# Patient Record
Sex: Female | Born: 1975
Health system: Southern US, Community
[De-identification: ages and names within clinical notes are randomized; demographics above are authoritative.]

## PROBLEM LIST (undated history)

## (undated) ENCOUNTER — Inpatient Hospital Stay (HOSPITAL_COMMUNITY): Payer: Self-pay

## (undated) DIAGNOSIS — O09529 Supervision of elderly multigravida, unspecified trimester: Secondary | ICD-10-CM

## (undated) DIAGNOSIS — D219 Benign neoplasm of connective and other soft tissue, unspecified: Secondary | ICD-10-CM

## (undated) DIAGNOSIS — N83209 Unspecified ovarian cyst, unspecified side: Secondary | ICD-10-CM

## (undated) DIAGNOSIS — R51 Headache: Secondary | ICD-10-CM

## (undated) DIAGNOSIS — R519 Headache, unspecified: Secondary | ICD-10-CM

## (undated) DIAGNOSIS — D649 Anemia, unspecified: Secondary | ICD-10-CM

## (undated) DIAGNOSIS — Z789 Other specified health status: Secondary | ICD-10-CM

## (undated) HISTORY — DX: Headache: R51

## (undated) HISTORY — DX: Supervision of elderly multigravida, unspecified trimester: O09.529

## (undated) HISTORY — DX: Anemia, unspecified: D64.9

## (undated) HISTORY — PX: NO PAST SURGERIES: SHX2092

## (undated) HISTORY — DX: Headache, unspecified: R51.9

## (undated) HISTORY — DX: Benign neoplasm of connective and other soft tissue, unspecified: D21.9

---

## 1997-06-13 DIAGNOSIS — K759 Inflammatory liver disease, unspecified: Secondary | ICD-10-CM

## 1997-06-13 HISTORY — DX: Inflammatory liver disease, unspecified: K75.9

## 2003-06-14 DIAGNOSIS — H11002 Unspecified pterygium of left eye: Secondary | ICD-10-CM

## 2003-06-14 HISTORY — DX: Unspecified pterygium of left eye: H11.002

## 2006-03-06 ENCOUNTER — Inpatient Hospital Stay (HOSPITAL_COMMUNITY): Admission: AD | Admit: 2006-03-06 | Discharge: 2006-03-06 | Payer: Self-pay | Admitting: Gynecology

## 2006-03-06 ENCOUNTER — Ambulatory Visit: Payer: Self-pay | Admitting: Obstetrics & Gynecology

## 2006-04-01 ENCOUNTER — Inpatient Hospital Stay (HOSPITAL_COMMUNITY): Admission: AD | Admit: 2006-04-01 | Discharge: 2006-04-03 | Payer: Self-pay | Admitting: Obstetrics & Gynecology

## 2006-04-01 ENCOUNTER — Ambulatory Visit: Payer: Self-pay | Admitting: Obstetrics and Gynecology

## 2009-06-06 ENCOUNTER — Inpatient Hospital Stay (HOSPITAL_COMMUNITY): Admission: AD | Admit: 2009-06-06 | Discharge: 2009-06-08 | Payer: Self-pay | Admitting: Obstetrics

## 2009-11-12 ENCOUNTER — Emergency Department (HOSPITAL_COMMUNITY): Admission: EM | Admit: 2009-11-12 | Discharge: 2009-11-12 | Payer: Self-pay | Admitting: Emergency Medicine

## 2009-12-23 ENCOUNTER — Ambulatory Visit: Payer: Self-pay | Admitting: Family Medicine

## 2009-12-23 DIAGNOSIS — S239XXA Sprain of unspecified parts of thorax, initial encounter: Secondary | ICD-10-CM | POA: Insufficient documentation

## 2009-12-23 DIAGNOSIS — S139XXA Sprain of joints and ligaments of unspecified parts of neck, initial encounter: Secondary | ICD-10-CM | POA: Insufficient documentation

## 2010-03-20 ENCOUNTER — Emergency Department (HOSPITAL_COMMUNITY): Admission: EM | Admit: 2010-03-20 | Discharge: 2010-03-20 | Payer: Self-pay | Admitting: Emergency Medicine
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
## 2010-08-26 LAB — URINALYSIS, ROUTINE W REFLEX MICROSCOPIC
Ketones, ur: NEGATIVE mg/dL
Nitrite: NEGATIVE
Specific Gravity, Urine: 1.014 (ref 1.005–1.030)
pH: 7.5 (ref 5.0–8.0)

## 2010-08-26 LAB — COMPREHENSIVE METABOLIC PANEL
AST: 15 U/L (ref 0–37)
Albumin: 3.1 g/dL — ABNORMAL LOW (ref 3.5–5.2)
Calcium: 8.8 mg/dL (ref 8.4–10.5)
Creatinine, Ser: 0.72 mg/dL (ref 0.4–1.2)
GFR calc Af Amer: >60 mL/min (ref >60)

## 2010-08-26 LAB — CBC
MCH: 27.5 pg (ref 26.0–34.0)
MCHC: 31.9 g/dL (ref 30.0–36.0)
Platelets: 272 10*3/uL (ref 150–400)
RBC: 3.82 MIL/uL — ABNORMAL LOW (ref 3.87–5.11)

## 2010-08-26 LAB — DIFFERENTIAL
Eosinophils Relative: 2 % (ref 0–5)
Lymphocytes Relative: 28 % (ref 12–46)
Lymphs Abs: 2 10*3/uL (ref 0.7–4.0)
Monocytes Absolute: 0.4 10*3/uL (ref 0.1–1.0)
Monocytes Relative: 5 % (ref 3–12)

## 2010-08-26 LAB — POCT PREGNANCY, URINE: Preg Test, Ur: NEGATIVE

## 2010-08-26 LAB — URINE MICROSCOPIC-ADD ON

## 2010-09-13 LAB — CBC
Hemoglobin: 11.9 g/dL — ABNORMAL LOW (ref 12.0–15.0)
MCHC: 32.7 g/dL (ref 30.0–36.0)
MCV: 86 fL (ref 78.0–100.0)
Platelets: 160 10*3/uL (ref 150–400)
RDW: 15.3 % (ref 11.5–15.5)
RDW: 15.5 % (ref 11.5–15.5)
WBC: 9.3 10*3/uL (ref 4.0–10.5)

## 2011-03-17 ENCOUNTER — Emergency Department (HOSPITAL_COMMUNITY)
Admission: EM | Admit: 2011-03-17 | Discharge: 2011-03-17 | Disposition: A | Discharge status: Home / Self Care | Payer: Self-pay | Attending: Emergency Medicine | Admitting: Emergency Medicine

## 2011-03-17 DIAGNOSIS — L03019 Cellulitis of unspecified finger: Secondary | ICD-10-CM | POA: Insufficient documentation

## 2011-03-17 DIAGNOSIS — M79609 Pain in unspecified limb: Secondary | ICD-10-CM | POA: Insufficient documentation

## 2013-03-17 ENCOUNTER — Encounter (HOSPITAL_COMMUNITY): Payer: Self-pay | Admitting: Cardiology

## 2013-03-17 ENCOUNTER — Emergency Department (HOSPITAL_COMMUNITY)
Admission: EM | Admit: 2013-03-17 | Discharge: 2013-03-17 | Disposition: A | Discharge status: Home / Self Care | Payer: Self-pay | Attending: Emergency Medicine | Admitting: Emergency Medicine

## 2013-03-17 ENCOUNTER — Emergency Department (HOSPITAL_COMMUNITY): Payer: Self-pay

## 2013-03-17 DIAGNOSIS — N949 Unspecified condition associated with female genital organs and menstrual cycle: Secondary | ICD-10-CM | POA: Insufficient documentation

## 2013-03-17 DIAGNOSIS — R110 Nausea: Secondary | ICD-10-CM | POA: Insufficient documentation

## 2013-03-17 DIAGNOSIS — Z3202 Encounter for pregnancy test, result negative: Secondary | ICD-10-CM | POA: Insufficient documentation

## 2013-03-17 DIAGNOSIS — R109 Unspecified abdominal pain: Secondary | ICD-10-CM | POA: Insufficient documentation

## 2013-03-17 DIAGNOSIS — N83209 Unspecified ovarian cyst, unspecified side: Secondary | ICD-10-CM | POA: Insufficient documentation

## 2013-03-17 DIAGNOSIS — Z79899 Other long term (current) drug therapy: Secondary | ICD-10-CM | POA: Insufficient documentation

## 2013-03-17 DIAGNOSIS — N83201 Unspecified ovarian cyst, right side: Secondary | ICD-10-CM

## 2013-03-17 DIAGNOSIS — D259 Leiomyoma of uterus, unspecified: Secondary | ICD-10-CM | POA: Insufficient documentation

## 2013-03-17 LAB — URINALYSIS, ROUTINE W REFLEX MICROSCOPIC
Bilirubin Urine: NEGATIVE
Glucose, UA: NEGATIVE mg/dL
Protein, ur: NEGATIVE mg/dL
Urobilinogen, UA: 0.2 mg/dL (ref 0.0–1.0)

## 2013-03-17 LAB — CBC WITH DIFFERENTIAL/PLATELET
Basophils Absolute: 0 10*3/uL (ref 0.0–0.1)
HCT: 37.1 % (ref 36.0–46.0)
Lymphocytes Relative: 28 % (ref 12–46)
Neutro Abs: 4.5 10*3/uL (ref 1.7–7.7)
Neutrophils Relative %: 65 % (ref 43–77)
Platelets: 252 10*3/uL (ref 150–400)
RDW: 13.1 % (ref 11.5–15.5)
WBC: 7 10*3/uL (ref 4.0–10.5)

## 2013-03-17 LAB — COMPREHENSIVE METABOLIC PANEL
ALT: 9 U/L (ref 0–35)
AST: 11 U/L (ref 0–37)
Albumin: 3.2 g/dL — ABNORMAL LOW (ref 3.5–5.2)
CO2: 26 mEq/L (ref 19–32)
Chloride: 103 mEq/L (ref 96–112)
GFR calc non Af Amer: >90 mL/min (ref >90)
Potassium: 3.3 mEq/L — ABNORMAL LOW (ref 3.5–5.1)
Sodium: 138 mEq/L (ref 135–145)
Total Bilirubin: 0.3 mg/dL (ref 0.3–1.2)

## 2013-03-17 LAB — WET PREP, GENITAL: Trich, Wet Prep: NONE SEEN

## 2013-03-17 LAB — URINE MICROSCOPIC-ADD ON

## 2013-03-17 MED ORDER — OXYCODONE-ACETAMINOPHEN 5-325 MG PO TABS: 1.0000 | ORAL_TABLET | Freq: Four times a day (QID) | ORAL | Status: DC | PRN

## 2013-03-17 MED ORDER — IBUPROFEN 200 MG PO TABS
600.0000 mg | ORAL_TABLET | Freq: Once | ORAL | Status: AC
  Administered 2013-03-17: 600 mg via ORAL
  Filled 2013-03-17: qty 3

## 2013-03-17 MED ORDER — ONDANSETRON 4 MG PO TBDP
8.0000 mg | ORAL_TABLET | Freq: Once | ORAL | Status: AC
  Administered 2013-03-17: 8 mg via ORAL
  Filled 2013-03-17: qty 2

## 2013-03-17 MED ORDER — OXYCODONE-ACETAMINOPHEN 5-325 MG PO TABS
1.0000 | ORAL_TABLET | Freq: Once | ORAL | Status: AC
  Administered 2013-03-17: 1 via ORAL
  Filled 2013-03-17: qty 1

## 2013-03-17 MED ORDER — HYDROMORPHONE HCL PF 2 MG/ML IJ SOLN
2.0000 mg | Freq: Once | INTRAMUSCULAR | Status: AC
  Administered 2013-03-17: 2 mg via INTRAMUSCULAR
  Filled 2013-03-17: qty 1

## 2013-03-17 MED ORDER — NAPROXEN 500 MG PO TABS: 500.0000 mg | ORAL_TABLET | Freq: Two times a day (BID) | ORAL | Status: DC

## 2013-03-17 NOTE — ED Notes (Signed)
Pt reports she is still having a lot of pain. Provider informed.

## 2013-03-17 NOTE — ED Notes (Signed)
Pt reports right lower quad pain that started yesterday. States she has had some nausea but no vomiting. Denies any urinary symptoms at this time. No abd surgeries but reports hx of fibroids.

## 2013-03-17 NOTE — ED Provider Notes (Signed)
Medical screening examination/treatment/procedure(s) were performed by non-physician practitioner and as supervising physician I was immediately available for consultation/collaboration.  Aariah Godette L Novaleigh Kohlman, MD 03/17/13 1721 

## 2013-03-17 NOTE — ED Notes (Signed)
Pt up to the bathroom

## 2013-03-17 NOTE — ED Provider Notes (Signed)
CSN: [id number]     Arrival date & time 03/17/13  0807 History   First MD Initiated Contact with Patient 03/17/13 0813     Chief Complaint  Patient presents with  . Abdominal Pain   (Consider location/radiation/quality/duration/timing/severity/associated sxs/prior Treatment) HPI Comments: Patient presents with complaint of intermittent right lower cardia pain for the past 12 hours. Pain is sharp and not worse with movement. It started in the right lower quadrant. It has been associated with nausea but no vomiting. No fever, diarrhea, dysuria or hematuria, vaginal bleeding or discharge. Patient is currently on birth control and states that she has not had a period in a long time. She has a history of fibroids/endometriosis? She has never had any abdominal or pelvic surgeries. No treatments prior to arrival. Onset of symptoms acute. Nothing makes the pain better or worse.  Patient is a 37 y.o. female presenting with abdominal pain. The history is provided by the patient.  Abdominal Pain Associated symptoms: nausea   Associated symptoms: no chest pain, no cough, no diarrhea, no dysuria, no fever, no hematuria, no sore throat, no vaginal bleeding, no vaginal discharge and no vomiting     History reviewed. No pertinent past medical history. History reviewed. No pertinent past surgical history. History reviewed. No pertinent family history. History  Substance Use Topics  . Smoking status: Never Smoker   . Smokeless tobacco: Not on file  . Alcohol Use: No   OB History   Grav Para Term Preterm Abortions TAB SAB Ect Mult Living                 Review of Systems  Constitutional: Negative for fever.  HENT: Negative for sore throat and rhinorrhea.   Eyes: Negative for redness.  Respiratory: Negative for cough.   Cardiovascular: Negative for chest pain.  Gastrointestinal: Positive for nausea and abdominal pain. Negative for vomiting and diarrhea.  Genitourinary: Positive for pelvic pain.  Negative for dysuria, hematuria, vaginal bleeding and vaginal discharge.  Musculoskeletal: Negative for myalgias.  Skin: Negative for rash.  Neurological: Negative for headaches.    Allergies  Review of patient's allergies indicates no known allergies.  Home Medications   Current Outpatient Rx  Name  Route  Sig  Dispense  Refill  . norgestimate-ethinyl estradiol (ORTHO-CYCLEN,SPRINTEC,PREVIFEM) 0.25-35 MG-MCG tablet   Oral   Take 1 tablet by mouth daily.          BP 107/72  Pulse 69  Temp(Src) 98 F (36.7 C) (Oral)  Resp 18  Ht 5\' 3"  (1.6 m)  Wt 230 lb (104.327 kg)  BMI 40.75 kg/m2  SpO2 100% Physical Exam  Nursing note and vitals reviewed. Constitutional: She appears well-developed and well-nourished.  HENT:  Head: Normocephalic and atraumatic.  Eyes: Conjunctivae are normal. Right eye exhibits no discharge. Left eye exhibits no discharge.  Neck: Normal range of motion. Neck supple.  Cardiovascular: Normal rate, regular rhythm and normal heart sounds.   Pulmonary/Chest: Effort normal and breath sounds normal.  Abdominal: Soft. Bowel sounds are normal. She exhibits no distension. There is tenderness in the right lower quadrant and suprapubic area. There is no rebound, no guarding and no CVA tenderness.  Genitourinary: Uterus is not enlarged and not tender. Cervix exhibits no motion tenderness, no discharge and no friability. Right adnexum displays tenderness. Right adnexum displays no mass and no fullness. Left adnexum displays no mass, no tenderness and no fullness. No tenderness or bleeding around the vagina. No foreign body around the vagina. No vaginal  discharge found.  Neurological: She is alert.  Skin: Skin is warm and dry.  Psychiatric: She has a normal mood and affect.    ED Course  Procedures (including critical care time) Labs Review Labs Reviewed  WET PREP, GENITAL - Abnormal; Notable for the following:    Clue Cells Wet Prep HPF POC FEW (*)    WBC, Wet  Prep HPF POC FEW (*)    All other components within normal limits  COMPREHENSIVE METABOLIC PANEL - Abnormal; Notable for the following:    Potassium 3.3 (*)    Albumin 3.2 (*)    Alkaline Phosphatase 37 (*)    All other components within normal limits  URINALYSIS, ROUTINE W REFLEX MICROSCOPIC - Abnormal; Notable for the following:    APPearance TURBID (*)    Hgb urine dipstick SMALL (*)    Leukocytes, UA SMALL (*)    All other components within normal limits  URINE MICROSCOPIC-ADD ON - Abnormal; Notable for the following:    Squamous Epithelial / LPF MANY (*)    Bacteria, UA MANY (*)    All other components within normal limits  GC/CHLAMYDIA PROBE AMP  URINE CULTURE  CBC WITH DIFFERENTIAL  POCT PREGNANCY, URINE   Imaging Review US Transvaginal Non-ob  03/17/2013   CLINICAL DATA:  Right lower quadrant pain. Concern for ovarian torsion. History of endometriosis.  EXAM: TRANSABDOMINAL AND TRANSVAGINAL ULTRASOUND OF PELVIS  DOPPLER ULTRASOUND OF OVARIES  TECHNIQUE: Both transabdominal and transvaginal ultrasound examinations of the pelvis were performed. Transabdominal technique was performed for global imaging of the pelvis including uterus, ovaries, adnexal regions, and pelvic cul-de-sac.  It was necessary to proceed with endovaginal exam following the transabdominal exam to visualize the endometrium and ovaries. Color and duplex Doppler ultrasound was utilized to evaluate blood flow to the ovaries.  COMPARISON:  None.  FINDINGS: Uterus  Measurements: Normal size 11.1 x 6.8 x 2.7 cm. The uterus does have heterogeneous echotexture. There are several rounded hypoechoic lesions consistent with leiomyomas. These leiomyomas are intramural with the largest measuring 3.4 cm. There are approximately 4 such leiomyomas along the posterior wall. No fibroids or other mass visualized.  Endometrium  Thickness: Normal in thickness for premenopausal female at 9 mm No focal abnormality visualized.  Right ovary   Measurements: Mildly enlarged at 5.4 x 4.5 x 4.0 cm. This enlargement is secondary to a rounded primarily anechoic cyst within the right ovary measuring 4.6 x 3.9 by 3.5 cm. There are scattered internal echoes within this minimally complex cyst. There is no significant flow or septation. .  Left ovary  Measurements: Normal at 3.8 x 1.1 x 2.1 cm. Normal appearance/no adnexal mass.  Pulsed Doppler evaluation of both ovaries demonstrates normal low-resistance arterial and venous waveforms.  Other findings  Small amount of free fluid the posterior cul-de-sac.  IMPRESSION: 1. Normal color Doppler flow and spectral waveforms to the left right ovary. No evidence of torsion.  2. Mildly complex cyst associated with the right ovary measuring up to 4.6 cm is likely a benign hemorrhagic cyst.  This is almost certainly benign, and no specific imaging follow up is recommended according to the Society of Radiologists in Ultrasound2010 Consensus Conference Statement Grier Rocher et al. Management of Asymptomatic Ovarian and Other Adnexal Cysts Imaged at [place]: Society of Radiologists in Ultrasound Consensus Conference Statement 2010. Radiology 256 (Sept 2010): 943-954.).  3. Four intramural leiomyoma within the uterus.   Electronically Signed   By: Genevive Bi M.D.   On: 03/17/2013  11:11   US Pelvis Complete  03/17/2013   CLINICAL DATA:  Right lower quadrant pain. Concern for ovarian torsion. History of endometriosis.  EXAM: TRANSABDOMINAL AND TRANSVAGINAL ULTRASOUND OF PELVIS  DOPPLER ULTRASOUND OF OVARIES  TECHNIQUE: Both transabdominal and transvaginal ultrasound examinations of the pelvis were performed. Transabdominal technique was performed for global imaging of the pelvis including uterus, ovaries, adnexal regions, and pelvic cul-de-sac.  It was necessary to proceed with endovaginal exam following the transabdominal exam to visualize the endometrium and ovaries. Color and duplex Doppler ultrasound was utilized to evaluate  blood flow to the ovaries.  COMPARISON:  None.  FINDINGS: Uterus  Measurements: Normal size 11.1 x 6.8 x 2.7 cm. The uterus does have heterogeneous echotexture. There are several rounded hypoechoic lesions consistent with leiomyomas. These leiomyomas are intramural with the largest measuring 3.4 cm. There are approximately 4 such leiomyomas along the posterior wall. No fibroids or other mass visualized.  Endometrium  Thickness: Normal in thickness for premenopausal female at 9 mm No focal abnormality visualized.  Right ovary  Measurements: Mildly enlarged at 5.4 x 4.5 x 4.0 cm. This enlargement is secondary to a rounded primarily anechoic cyst within the right ovary measuring 4.6 x 3.9 by 3.5 cm. There are scattered internal echoes within this minimally complex cyst. There is no significant flow or septation. .  Left ovary  Measurements: Normal at 3.8 x 1.1 x 2.1 cm. Normal appearance/no adnexal mass.  Pulsed Doppler evaluation of both ovaries demonstrates normal low-resistance arterial and venous waveforms.  Other findings  Small amount of free fluid the posterior cul-de-sac.  IMPRESSION: 1. Normal color Doppler flow and spectral waveforms to the left right ovary. No evidence of torsion.  2. Mildly complex cyst associated with the right ovary measuring up to 4.6 cm is likely a benign hemorrhagic cyst.  This is almost certainly benign, and no specific imaging follow up is recommended according to the Society of Radiologists in Ultrasound2010 Consensus Conference Statement Grier Rocher et al. Management of Asymptomatic Ovarian and Other Adnexal Cysts Imaged at [place]: Society of Radiologists in Ultrasound Consensus Conference Statement 2010. Radiology 256 (Sept 2010): 943-954.).  3. Four intramural leiomyoma within the uterus.   Electronically Signed   By: Genevive Bi M.D.   On: 03/17/2013 11:11   [place] Art/ven Flow Abd Pelv Doppler  03/17/2013   CLINICAL DATA:  Right lower quadrant pain. Concern for ovarian torsion.  History of endometriosis.  EXAM: TRANSABDOMINAL AND TRANSVAGINAL ULTRASOUND OF PELVIS  DOPPLER ULTRASOUND OF OVARIES  TECHNIQUE: Both transabdominal and transvaginal ultrasound examinations of the pelvis were performed. Transabdominal technique was performed for global imaging of the pelvis including uterus, ovaries, adnexal regions, and pelvic cul-de-sac.  It was necessary to proceed with endovaginal exam following the transabdominal exam to visualize the endometrium and ovaries. Color and duplex Doppler ultrasound was utilized to evaluate blood flow to the ovaries.  COMPARISON:  None.  FINDINGS: Uterus  Measurements: Normal size 11.1 x 6.8 x 2.7 cm. The uterus does have heterogeneous echotexture. There are several rounded hypoechoic lesions consistent with leiomyomas. These leiomyomas are intramural with the largest measuring 3.4 cm. There are approximately 4 such leiomyomas along the posterior wall. No fibroids or other mass visualized.  Endometrium  Thickness: Normal in thickness for premenopausal female at 9 mm No focal abnormality visualized.  Right ovary  Measurements: Mildly enlarged at 5.4 x 4.5 x 4.0 cm. This enlargement is secondary to a rounded primarily anechoic cyst within the right ovary measuring 4.6  x 3.9 by 3.5 cm. There are scattered internal echoes within this minimally complex cyst. There is no significant flow or septation. .  Left ovary  Measurements: Normal at 3.8 x 1.1 x 2.1 cm. Normal appearance/no adnexal mass.  Pulsed Doppler evaluation of both ovaries demonstrates normal low-resistance arterial and venous waveforms.  Other findings  Small amount of free fluid the posterior cul-de-sac.  IMPRESSION: 1. Normal color Doppler flow and spectral waveforms to the left right ovary. No evidence of torsion.  2. Mildly complex cyst associated with the right ovary measuring up to 4.6 cm is likely a benign hemorrhagic cyst.  This is almost certainly benign, and no specific imaging follow up is  recommended according to the Society of Radiologists in Ultrasound2010 Consensus Conference Statement Grier Rocher et al. Management of Asymptomatic Ovarian and Other Adnexal Cysts Imaged at [place]: Society of Radiologists in Ultrasound Consensus Conference Statement 2010. Radiology 256 (Sept 2010): 943-954.).  3. Four intramural leiomyoma within the uterus.   Electronically Signed   By: Genevive Bi M.D.   On: 03/17/2013 11:11    8:41 AM Patient seen and examined. Work-up initiated. Medications ordered.   Vital signs reviewed and are as follows: Filed Vitals:   03/17/13 0821  BP: 107/72  Pulse: 69  Temp: 98 F (36.7 C)  Resp: 18   Pt updated. Pelvic performed with nurse tech chaperone.   12:16 PM Pelvic ultrasound performed. Pt informed of results. Will d/c to home with comfort measures, GYN f/u. Pt did require IM pain medication prior to discharge.   The patient was urged to return to the Emergency Department immediately with worsening of current symptoms, worsening abdominal pain, persistent vomiting, blood noted in stools, fever, or any other concerns. The patient verbalized understanding.     MDM   1. Ovarian cyst, right   2. Uterine fibroid    Patient with right lower quadrant abdominal pain. Pain is consistent with ovarian cyst, with acute onset, no other systemic symptoms including fever. WBC count is normal. Ultrasound shows no torsion. The ultrasound also shows uterine fibroids. Patient otherwise appears well. I do not suspect appendicitis given history and alternative diagnosis suggested by ultrasound. GYN follow up given. UA not clean catch, history not particularly suspicious for urinary tract infection. Culture sent.    Renne Crigler, PA-C 03/17/13 1219

## 2013-03-18 LAB — GC/CHLAMYDIA PROBE AMP
CT Probe RNA: NEGATIVE
GC Probe RNA: NEGATIVE

## 2013-03-19 LAB — URINE CULTURE

## 2013-03-29 ENCOUNTER — Ambulatory Visit (INDEPENDENT_AMBULATORY_CARE_PROVIDER_SITE_OTHER): Payer: Self-pay

## 2013-03-29 DIAGNOSIS — Z23 Encounter for immunization: Secondary | ICD-10-CM

## 2014-06-09 ENCOUNTER — Other Ambulatory Visit (HOSPITAL_COMMUNITY): Payer: Self-pay | Admitting: *Deleted

## 2014-06-09 DIAGNOSIS — N63 Unspecified lump in unspecified breast: Secondary | ICD-10-CM

## 2014-06-26 ENCOUNTER — Other Ambulatory Visit: Payer: Self-pay

## 2014-06-26 ENCOUNTER — Inpatient Hospital Stay: Admission: RE | Admit: 2014-06-26 | Payer: Self-pay | Source: Ambulatory Visit

## 2014-06-26 ENCOUNTER — Ambulatory Visit (HOSPITAL_COMMUNITY): Payer: Self-pay | Attending: Obstetrics and Gynecology

## 2014-07-17 ENCOUNTER — Ambulatory Visit
Admission: RE | Admit: 2014-07-17 | Discharge: 2014-07-17 | Disposition: A | Discharge status: Home / Self Care | Payer: No Typology Code available for payment source | Source: Ambulatory Visit | Attending: Obstetrics and Gynecology | Admitting: Obstetrics and Gynecology

## 2014-07-17 ENCOUNTER — Ambulatory Visit (HOSPITAL_COMMUNITY)
Admission: RE | Admit: 2014-07-17 | Discharge: 2014-07-17 | Disposition: A | Discharge status: Home / Self Care | Payer: Self-pay | Source: Ambulatory Visit | Attending: Obstetrics and Gynecology | Admitting: Obstetrics and Gynecology

## 2014-07-17 ENCOUNTER — Other Ambulatory Visit: Payer: Self-pay

## 2014-07-17 ENCOUNTER — Encounter (HOSPITAL_COMMUNITY): Payer: Self-pay

## 2014-07-17 VITALS — BP 116/74 | Temp 98.4°F | Ht 64.0 in | Wt 225.2 lb

## 2014-07-17 DIAGNOSIS — N6321 Unspecified lump in the left breast, upper outer quadrant: Secondary | ICD-10-CM

## 2014-07-17 DIAGNOSIS — N6323 Unspecified lump in the left breast, lower outer quadrant: Secondary | ICD-10-CM

## 2014-07-17 DIAGNOSIS — N63 Unspecified lump in unspecified breast: Secondary | ICD-10-CM

## 2014-07-17 DIAGNOSIS — Z1239 Encounter for other screening for malignant neoplasm of breast: Secondary | ICD-10-CM

## 2014-07-17 NOTE — Patient Instructions (Signed)
Explained to Kristy Johnson that she needs a Pap smear today due to last Pap smear was 02/03/2011. Patient is currently on her menstrual period and has scheduled an appointment at the [facility] Department for a Pap smear on 08/08/2014. Referred patient to the [facility] for diagnostic mammogram and possible bilateral breast ultrasounds. Appointment scheduled for Thursday, July 17, 2014 at 1300. Patient aware of appointment and will be there. Noel Journey Lares verbalized understanding.  Donn Zanetti, Arvil [place], RN 12:47 PM

## 2014-07-17 NOTE — Progress Notes (Signed)
Complaints of bilateral breasts lumps and pain. Patient stated she is no longer have pain and thinks the lumps may have decreases in size since May 2015.  Pap Smear:  Pap smear not completed today. Last Pap smear was 02/03/2011 at [facility] and normal. Per patient has no history of an abnormal Pap smear. Unable to complete Pap smear today due to patient being on her menstrual period. Patient is scheduled to have a Pap smear completed at the [facility] Department on 08/08/2014. Last Pap smear result is in EPIC.  Physical exam: Breasts Breasts symmetrical. No skin abnormalities bilateral breasts. No nipple retraction bilateral breasts. No nipple discharge bilateral breasts. No lymphadenopathy. No lumps palpated right breast. Palpated two lumps within the left breast at 2 o'clock 5 cm from the nipple and 5 o'clock under the areola. Complaints of tenderness when palpated both lumps and the right lower breast on exam. Referred patient to the [facility] for diagnostic mammogram and possible bilateral breast ultrasounds. Appointment scheduled for Thursday, July 17, 2014 at 1300.     Pelvic/Bimanual No Pap smear completed today since patient is currently on her menstrual period. Pap smear not indicated per BCCCP guidelines.

## 2014-08-18 ENCOUNTER — Encounter (HOSPITAL_COMMUNITY): Payer: Self-pay | Admitting: *Deleted

## 2015-06-15 ENCOUNTER — Emergency Department (HOSPITAL_COMMUNITY)
Admission: EM | Admit: 2015-06-15 | Discharge: 2015-06-15 | Disposition: A | Discharge status: Home / Self Care | Payer: No Typology Code available for payment source | Attending: Emergency Medicine | Admitting: Emergency Medicine

## 2015-06-15 ENCOUNTER — Encounter (HOSPITAL_COMMUNITY): Payer: Self-pay | Admitting: Emergency Medicine

## 2015-06-15 ENCOUNTER — Emergency Department (HOSPITAL_COMMUNITY): Payer: No Typology Code available for payment source

## 2015-06-15 DIAGNOSIS — R519 Headache, unspecified: Secondary | ICD-10-CM

## 2015-06-15 DIAGNOSIS — S0993XA Unspecified injury of face, initial encounter: Secondary | ICD-10-CM | POA: Diagnosis not present

## 2015-06-15 DIAGNOSIS — R51 Headache: Secondary | ICD-10-CM

## 2015-06-15 DIAGNOSIS — Y998 Other external cause status: Secondary | ICD-10-CM | POA: Diagnosis not present

## 2015-06-15 DIAGNOSIS — S4992XA Unspecified injury of left shoulder and upper arm, initial encounter: Secondary | ICD-10-CM | POA: Diagnosis not present

## 2015-06-15 DIAGNOSIS — S0990XA Unspecified injury of head, initial encounter: Secondary | ICD-10-CM | POA: Insufficient documentation

## 2015-06-15 DIAGNOSIS — Y9389 Activity, other specified: Secondary | ICD-10-CM | POA: Diagnosis not present

## 2015-06-15 DIAGNOSIS — S79912A Unspecified injury of left hip, initial encounter: Secondary | ICD-10-CM | POA: Insufficient documentation

## 2015-06-15 DIAGNOSIS — S199XXA Unspecified injury of neck, initial encounter: Secondary | ICD-10-CM | POA: Insufficient documentation

## 2015-06-15 DIAGNOSIS — Y92410 Unspecified street and highway as the place of occurrence of the external cause: Secondary | ICD-10-CM | POA: Insufficient documentation

## 2015-06-15 DIAGNOSIS — Z79818 Long term (current) use of other agents affecting estrogen receptors and estrogen levels: Secondary | ICD-10-CM | POA: Insufficient documentation

## 2015-06-15 DIAGNOSIS — M542 Cervicalgia: Secondary | ICD-10-CM

## 2015-06-15 MED ORDER — METHOCARBAMOL 500 MG PO TABS: 500.0000 mg | ORAL_TABLET | Freq: Two times a day (BID) | ORAL | Status: DC

## 2015-06-15 MED ORDER — KETOROLAC TROMETHAMINE 60 MG/2ML IM SOLN
30.0000 mg | Freq: Once | INTRAMUSCULAR | Status: AC
  Administered 2015-06-15: 30 mg via INTRAMUSCULAR
  Filled 2015-06-15: qty 2

## 2015-06-15 NOTE — Discharge Instructions (Signed)
1. Medications: robaxin is your muscle relaxer to aide with pain - This can make you very drowsy - please do not drink or drive on this medication; tylenol and/or ibuprofen as needed for pain; continue usual home medications 2. Treatment: rest, drink plenty of fluids, ice to affected area as described below 3. Follow Up: Please follow up with your primary doctor in 2-3 days for discussion of your diagnoses and further evaluation after today's visit; Please return to the ER for vomiting, change in mental status, new or worsening symptoms, any additional concerns.    COLD THERAPY DIRECTIONS:  Ice or gel packs can be used to reduce both pain and swelling. Ice is the most helpful within the first 24 to 48 hours after an injury or flareup from overusing a muscle or joint.  Ice is effective, has very few side effects, and is safe for most people to use.   If you expose your skin to cold temperatures for too long or without the proper protection, you can damage your skin or nerves. Watch for signs of skin damage due to cold.   HOME CARE INSTRUCTIONS  Follow these tips to use ice and cold packs safely.  Place a dry or damp towel between the ice and skin. A damp towel will cool the skin more quickly, so you may need to shorten the time that the ice is used.  For a more rapid response, add gentle compression to the ice.  Ice for no more than 10 to 20 minutes at a time. The bonier the area you are icing, the less time it will take to get the benefits of ice.  Check your skin after 5 minutes to make sure there are no signs of a poor response to cold or skin damage.  Rest 20 minutes or more in between uses.  Once your skin is numb, you can end your treatment. You can test numbness by very lightly touching your skin. The touch should be so light that you do not see the skin dimple from the pressure of your fingertip. When using ice, most people will feel these normal sensations in this order: cold, burning,  aching, and numbness.  Do not use ice on someone who cannot communicate their responses to pain, such as small children or people with dementia.
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
## 2015-07-24 ENCOUNTER — Other Ambulatory Visit: Payer: Self-pay

## 2016-06-09 DIAGNOSIS — H04123 Dry eye syndrome of bilateral lacrimal glands: Secondary | ICD-10-CM | POA: Diagnosis not present

## 2017-02-27 DIAGNOSIS — N911 Secondary amenorrhea: Secondary | ICD-10-CM | POA: Diagnosis not present

## 2017-02-27 DIAGNOSIS — D259 Leiomyoma of uterus, unspecified: Secondary | ICD-10-CM | POA: Diagnosis not present

## 2017-03-08 DIAGNOSIS — Z3685 Encounter for antenatal screening for Streptococcus B: Secondary | ICD-10-CM | POA: Diagnosis not present

## 2017-03-08 DIAGNOSIS — Z3481 Encounter for supervision of other normal pregnancy, first trimester: Secondary | ICD-10-CM | POA: Diagnosis not present

## 2017-03-08 DIAGNOSIS — Z3480 Encounter for supervision of other normal pregnancy, unspecified trimester: Secondary | ICD-10-CM | POA: Diagnosis not present

## 2017-03-08 LAB — OB RESULTS CONSOLE HEPATITIS B SURFACE ANTIGEN: HEP B S AG: NEGATIVE

## 2017-03-08 LAB — OB RESULTS CONSOLE ABO/RH: RH TYPE: POSITIVE

## 2017-03-08 LAB — OB RESULTS CONSOLE GC/CHLAMYDIA
Chlamydia: NEGATIVE
Gonorrhea: NEGATIVE

## 2017-03-08 LAB — OB RESULTS CONSOLE RUBELLA ANTIBODY, IGM: Rubella: IMMUNE

## 2017-03-08 LAB — OB RESULTS CONSOLE RPR: RPR: NONREACTIVE

## 2017-03-08 LAB — OB RESULTS CONSOLE HIV ANTIBODY (ROUTINE TESTING): HIV: NONREACTIVE

## 2017-03-15 DIAGNOSIS — Z113 Encounter for screening for infections with a predominantly sexual mode of transmission: Secondary | ICD-10-CM | POA: Diagnosis not present

## 2017-03-15 DIAGNOSIS — Z3491 Encounter for supervision of normal pregnancy, unspecified, first trimester: Secondary | ICD-10-CM | POA: Diagnosis not present

## 2017-03-15 DIAGNOSIS — Z3480 Encounter for supervision of other normal pregnancy, unspecified trimester: Secondary | ICD-10-CM | POA: Diagnosis not present

## 2017-03-27 DIAGNOSIS — O09521 Supervision of elderly multigravida, first trimester: Secondary | ICD-10-CM | POA: Diagnosis not present

## 2017-03-27 DIAGNOSIS — Z3682 Encounter for antenatal screening for nuchal translucency: Secondary | ICD-10-CM | POA: Diagnosis not present

## 2017-05-08 DIAGNOSIS — N760 Acute vaginitis: Secondary | ICD-10-CM | POA: Diagnosis not present

## 2017-05-08 DIAGNOSIS — Z363 Encounter for antenatal screening for malformations: Secondary | ICD-10-CM | POA: Diagnosis not present

## 2017-05-08 DIAGNOSIS — Z3A18 18 weeks gestation of pregnancy: Secondary | ICD-10-CM | POA: Diagnosis not present

## 2017-05-08 DIAGNOSIS — Z3480 Encounter for supervision of other normal pregnancy, unspecified trimester: Secondary | ICD-10-CM | POA: Diagnosis not present

## 2017-05-08 DIAGNOSIS — B373 Candidiasis of vulva and vagina: Secondary | ICD-10-CM | POA: Diagnosis not present

## 2017-06-09 DIAGNOSIS — Z362 Encounter for other antenatal screening follow-up: Secondary | ICD-10-CM | POA: Diagnosis not present

## 2017-06-09 DIAGNOSIS — Z3A23 23 weeks gestation of pregnancy: Secondary | ICD-10-CM | POA: Diagnosis not present

## 2017-06-13 NOTE — L&D Delivery Note (Signed)
Delivery Note At 3:19 AM a viable female was delivered via Vaginal, Spontaneous (Presentation:OA ;  ).  APGAR:9-9 , ; weight  .   Placenta status:SPONT>>INTACT , .  Cord:  with the following complications: .  Cord pH: sent  Anesthesia:  epid Episiotomy: None Lacerations: None Suture Repair: NA Est. Blood Loss (mL): 300  Mom to postpartum.  Baby to Couplet care / Skin to Skin.  [place] 09/30/2017, 3:29 AM

## 2017-07-04 DIAGNOSIS — Z3480 Encounter for supervision of other normal pregnancy, unspecified trimester: Secondary | ICD-10-CM | POA: Diagnosis not present

## 2017-07-04 DIAGNOSIS — Z23 Encounter for immunization: Secondary | ICD-10-CM | POA: Diagnosis not present

## 2017-07-15 ENCOUNTER — Ambulatory Visit (HOSPITAL_COMMUNITY): Payer: BLUE CROSS/BLUE SHIELD

## 2017-07-15 ENCOUNTER — Encounter (HOSPITAL_COMMUNITY): Payer: Self-pay | Admitting: Anesthesiology

## 2017-07-15 ENCOUNTER — Encounter (HOSPITAL_COMMUNITY): Payer: Self-pay

## 2017-07-15 ENCOUNTER — Inpatient Hospital Stay (HOSPITAL_COMMUNITY): Payer: BLUE CROSS/BLUE SHIELD

## 2017-07-15 ENCOUNTER — Other Ambulatory Visit: Payer: Self-pay

## 2017-07-15 ENCOUNTER — Inpatient Hospital Stay (HOSPITAL_COMMUNITY)
Admission: AD | Admit: 2017-07-15 | Discharge: 2017-07-18 | DRG: 831 | Disposition: A | Discharge status: Home / Self Care | Payer: BLUE CROSS/BLUE SHIELD | Source: Ambulatory Visit | Attending: Obstetrics and Gynecology | Admitting: Obstetrics and Gynecology

## 2017-07-15 DIAGNOSIS — R55 Syncope and collapse: Secondary | ICD-10-CM

## 2017-07-15 DIAGNOSIS — O3413 Maternal care for benign tumor of corpus uteri, third trimester: Secondary | ICD-10-CM | POA: Diagnosis not present

## 2017-07-15 DIAGNOSIS — R05 Cough: Secondary | ICD-10-CM

## 2017-07-15 DIAGNOSIS — O219 Vomiting of pregnancy, unspecified: Secondary | ICD-10-CM

## 2017-07-15 DIAGNOSIS — R6883 Chills (without fever): Secondary | ICD-10-CM | POA: Diagnosis not present

## 2017-07-15 DIAGNOSIS — Z3A28 28 weeks gestation of pregnancy: Secondary | ICD-10-CM

## 2017-07-15 DIAGNOSIS — O3410 Maternal care for benign tumor of corpus uteri, unspecified trimester: Secondary | ICD-10-CM

## 2017-07-15 DIAGNOSIS — J069 Acute upper respiratory infection, unspecified: Secondary | ICD-10-CM | POA: Diagnosis present

## 2017-07-15 DIAGNOSIS — O99013 Anemia complicating pregnancy, third trimester: Secondary | ICD-10-CM | POA: Diagnosis present

## 2017-07-15 DIAGNOSIS — O26893 Other specified pregnancy related conditions, third trimester: Secondary | ICD-10-CM

## 2017-07-15 DIAGNOSIS — J189 Pneumonia, unspecified organism: Secondary | ICD-10-CM | POA: Diagnosis present

## 2017-07-15 DIAGNOSIS — O212 Late vomiting of pregnancy: Secondary | ICD-10-CM | POA: Diagnosis present

## 2017-07-15 DIAGNOSIS — E860 Dehydration: Secondary | ICD-10-CM | POA: Diagnosis present

## 2017-07-15 DIAGNOSIS — R0602 Shortness of breath: Secondary | ICD-10-CM | POA: Diagnosis not present

## 2017-07-15 DIAGNOSIS — O99513 Diseases of the respiratory system complicating pregnancy, third trimester: Secondary | ICD-10-CM | POA: Diagnosis not present

## 2017-07-15 DIAGNOSIS — R69 Illness, unspecified: Secondary | ICD-10-CM

## 2017-07-15 DIAGNOSIS — R102 Pelvic and perineal pain: Secondary | ICD-10-CM | POA: Diagnosis not present

## 2017-07-15 DIAGNOSIS — R109 Unspecified abdominal pain: Secondary | ICD-10-CM | POA: Diagnosis not present

## 2017-07-15 DIAGNOSIS — D649 Anemia, unspecified: Secondary | ICD-10-CM | POA: Diagnosis present

## 2017-07-15 DIAGNOSIS — R000 Tachycardia, unspecified: Secondary | ICD-10-CM

## 2017-07-15 DIAGNOSIS — D259 Leiomyoma of uterus, unspecified: Secondary | ICD-10-CM | POA: Diagnosis not present

## 2017-07-15 DIAGNOSIS — J111 Influenza due to unidentified influenza virus with other respiratory manifestations: Secondary | ICD-10-CM | POA: Diagnosis present

## 2017-07-15 DIAGNOSIS — R059 Cough, unspecified: Secondary | ICD-10-CM

## 2017-07-15 DIAGNOSIS — B349 Viral infection, unspecified: Secondary | ICD-10-CM

## 2017-07-15 HISTORY — DX: Other specified health status: Z78.9

## 2017-07-15 LAB — LACTIC ACID, PLASMA
LACTIC ACID, VENOUS: 1.4 mmol/L (ref 0.5–1.9)
Lactic Acid, Venous: 3.5 mmol/L (ref 0.5–1.9)

## 2017-07-15 LAB — CBC WITH DIFFERENTIAL/PLATELET
Basophils Absolute: 0 10*3/uL (ref 0.0–0.1)
Basophils Relative: 0 %
Eosinophils Absolute: 0.1 10*3/uL (ref 0.0–0.7)
Eosinophils Relative: 1 %
HCT: 30.9 % — ABNORMAL LOW (ref 36.0–46.0)
HEMOGLOBIN: 9.8 g/dL — AB (ref 12.0–15.0)
LYMPHS PCT: 16 %
Lymphs Abs: 0.9 10*3/uL (ref 0.7–4.0)
MCH: 25.5 pg — AB (ref 26.0–34.0)
MCHC: 31.7 g/dL (ref 30.0–36.0)
MCV: 80.5 fL (ref 78.0–100.0)
MONOS PCT: 4 %
Monocytes Absolute: 0.3 10*3/uL (ref 0.1–1.0)
NEUTROS PCT: 79 %
Neutro Abs: 4.6 10*3/uL (ref 1.7–7.7)
Platelets: 210 10*3/uL (ref 150–400)
RBC: 3.84 MIL/uL — AB (ref 3.87–5.11)
RDW: 16.1 % — ABNORMAL HIGH (ref 11.5–15.5)
WBC: 5.8 10*3/uL (ref 4.0–10.5)

## 2017-07-15 LAB — COMPREHENSIVE METABOLIC PANEL
ALBUMIN: 2.4 g/dL — AB (ref 3.5–5.0)
ALT: 12 U/L — AB (ref 14–54)
AST: 20 U/L (ref 15–41)
Alkaline Phosphatase: 50 U/L (ref 38–126)
Anion gap: 9 (ref 5–15)
BUN: <5 mg/dL — ABNORMAL LOW (ref 6–20)
CALCIUM: 8.2 mg/dL — AB (ref 8.9–10.3)
CO2: 18 mmol/L — ABNORMAL LOW (ref 22–32)
CREATININE: 0.52 mg/dL (ref 0.44–1.00)
Chloride: 104 mmol/L (ref 101–111)
GFR calc Af Amer: >60 mL/min (ref >60)
GLUCOSE: 142 mg/dL — AB (ref 65–99)
Potassium: 3.4 mmol/L — ABNORMAL LOW (ref 3.5–5.1)
Sodium: 131 mmol/L — ABNORMAL LOW (ref 135–145)
TOTAL PROTEIN: 5.2 g/dL — AB (ref 6.5–8.1)
Total Bilirubin: 0.3 mg/dL (ref 0.3–1.2)

## 2017-07-15 LAB — URINALYSIS, ROUTINE W REFLEX MICROSCOPIC
Bilirubin Urine: NEGATIVE
Glucose, UA: NEGATIVE mg/dL
HGB URINE DIPSTICK: NEGATIVE
Ketones, ur: 80 mg/dL — AB
Leukocytes, UA: NEGATIVE
NITRITE: NEGATIVE
Protein, ur: NEGATIVE mg/dL
SPECIFIC GRAVITY, URINE: 1.021 (ref 1.005–1.030)
pH: 7 (ref 5.0–8.0)

## 2017-07-15 LAB — TYPE AND SCREEN
ABO/RH(D): A POS
Antibody Screen: NEGATIVE

## 2017-07-15 LAB — INFLUENZA PANEL BY PCR (TYPE A & B)
INFLAPCR: NEGATIVE
Influenza B By PCR: NEGATIVE

## 2017-07-15 MED ORDER — LACTATED RINGERS IV BOLUS (SEPSIS)
1000.0000 mL | Freq: Once | INTRAVENOUS | Status: AC
  Administered 2017-07-15: 1000 mL via INTRAVENOUS

## 2017-07-15 MED ORDER — CALCIUM CARBONATE ANTACID 500 MG PO CHEW: 2.0000 | CHEWABLE_TABLET | ORAL | Status: DC | PRN

## 2017-07-15 MED ORDER — PRENATAL MULTIVITAMIN CH
1.0000 | ORAL_TABLET | Freq: Every day | ORAL | Status: DC
  Administered 2017-07-17: 1 via ORAL
  Filled 2017-07-15: qty 1

## 2017-07-15 MED ORDER — OSELTAMIVIR PHOSPHATE 75 MG PO CAPS
75.0000 mg | ORAL_CAPSULE | Freq: Two times a day (BID) | ORAL | Status: DC
  Administered 2017-07-15 – 2017-07-16 (×3): 75 mg via ORAL
  Filled 2017-07-15 (×4): qty 1

## 2017-07-15 MED ORDER — DOCUSATE SODIUM 100 MG PO CAPS
100.0000 mg | ORAL_CAPSULE | Freq: Every day | ORAL | Status: DC
  Administered 2017-07-16 – 2017-07-18 (×3): 100 mg via ORAL
  Filled 2017-07-15 (×3): qty 1

## 2017-07-15 MED ORDER — OXYCODONE-ACETAMINOPHEN 5-325 MG PO TABS
1.0000 | ORAL_TABLET | Freq: Four times a day (QID) | ORAL | Status: DC | PRN
  Administered 2017-07-15 – 2017-07-16 (×2): 2 via ORAL
  Filled 2017-07-15 (×2): qty 2

## 2017-07-15 MED ORDER — ALBUTEROL SULFATE (2.5 MG/3ML) 0.083% IN NEBU
2.5000 mg | INHALATION_SOLUTION | Freq: Once | RESPIRATORY_TRACT | Status: AC
  Administered 2017-07-15: 2.5 mg via RESPIRATORY_TRACT
  Filled 2017-07-15: qty 3

## 2017-07-15 MED ORDER — KETOROLAC TROMETHAMINE 30 MG/ML IJ SOLN
30.0000 mg | Freq: Once | INTRAMUSCULAR | Status: AC
Start: 2017-07-15 — End: 2017-07-15
  Administered 2017-07-15: 30 mg via INTRAVENOUS
  Filled 2017-07-15: qty 1

## 2017-07-15 MED ORDER — M.V.I. ADULT IV INJ
Freq: Once | INTRAVENOUS | Status: AC
  Administered 2017-07-15: 13:00:00 via INTRAVENOUS
  Filled 2017-07-15: qty 1000

## 2017-07-15 NOTE — Progress Notes (Addendum)
G3P2 @ [redacted]wksga. here due to flu like symptoms and right sided pain. Denies bleeding or lof. +FM  Hx of fibroids, obese weighing 242 lbs.   1200: labs and IV done. Flu swab collected  1255: back from xray. Monitors placed.  1301: respiratory at bs for breathing tx   Provider/MD/anesthesia at bs for low bp. Dr. Matthew Saras ordered for another IV line with bolus LR.  0762: ordered to admit  1510: rapid response nurse attempting to place IV.   1520: carelink dispatcher Baxter Flattery notified. No available truck at this time but will call once one is available  1529: IV attempted by two nurses. anesthesia notified. Stated will get here as soon as possible. At the moment taking care of more urgent matter with another patient.   1535: anesthesia at bs for IV placement.   1543: IV placed by anesthesia and bolus infusing. Lab at bs.   1548: dr. Matthew Saras called to MAU. Status update given. No new orders received.  Notified upstairs charge nurse. Room assigned to 306.   Provider at bs for SVE closed.   Care link at bs.

## 2017-07-15 NOTE — MAU Provider Note (Signed)
History     CSN: [id number]  Arrival date and time: 07/15/17 1106   First Provider Initiated Contact with Patient 07/15/17 1234      Chief Complaint  Patient presents with  . Influenza  . Abdominal Pain   HPI    [name] is a 42 y.o. female [phone] @ 87w4dhere in MAU with flu like symptoms that started Thursday. States she has chills, all over body aches, and abdominal pain. Patient is tearful and says she feels bad. States she also has abdominal pain that started today. The pain is located in the middle of her abdomen and moves toward the RLQ. The pain comes and goes. She denies bleeding. + fetal movement. No N/V. She has not taken any medication for the symptoms.  Patient rates her pain 4/10 currently.   OB History    Gravida Para Term Preterm AB Living   _0 SAB TAB Ectopic Multiple Live Births     1            No past medical history on file.  No past surgical history on file.  Family History  Problem Relation Age of Onset  . Hypertension Maternal Grandmother     Social History   Tobacco Use  . Smoking status: Never Smoker  Substance Use Topics  . Alcohol use: No  . Drug use: No    Allergies: No Known Allergies  Medications Prior to Admission  Medication Sig Dispense Refill Last Dose  . folic acid (FOLVITE) 4270MCG tablet Take 400 mcg by mouth daily.   07/14/2017 at Unknown time  . Prenatal Vit-Fe Fumarate-FA (PRENATAL MULTIVITAMIN) TABS tablet Take 1 tablet by mouth daily at 12 noon.   07/15/2017 at Unknown time  . Pseudoephedrine-APAP-DM (DAYQUIL MULTI-SYMPTOM COLD/FLU PO) Take 10 mLs by mouth daily as needed (For cold symptoms.).   07/14/2017 at Unknown time   Results for orders placed or performed during the hospital encounter of 07/15/17 (from the past 48 hour(s))  Urinalysis, Routine w reflex microscopic     Status: Abnormal   Collection Time: 07/15/17 11:13 AM  Result Value Ref Range   Color, Urine AMBER (A) YELLOW    Comment:  BIOCHEMICALS MAY BE AFFECTED BY COLOR   APPearance HAZY (A) CLEAR   Specific Gravity, Urine 1.021 1.005 - 1.030   pH 7.0 5.0 - 8.0   Glucose, UA NEGATIVE NEGATIVE mg/dL   Hgb urine dipstick NEGATIVE NEGATIVE   Bilirubin Urine NEGATIVE NEGATIVE   Ketones, ur 80 (A) NEGATIVE mg/dL   Protein, ur NEGATIVE NEGATIVE mg/dL   Nitrite NEGATIVE NEGATIVE   Leukocytes, UA NEGATIVE NEGATIVE    Comment: Performed at [facility] [address], [place] [id number] CBC with Differential     Status: Abnormal   Collection Time: 07/15/17 12:00 PM  Result Value Ref Range   WBC 5.8 4.0 - 10.5 K/uL   RBC 3.84 (L) 3.87 - 5.11 MIL/uL   Hemoglobin 9.8 (L) 12.0 - 15.0 g/dL   HCT 30.9 (L) 36.0 - 46.0 %   MCV 80.5 78.0 - 100.0 fL   MCH 25.5 (L) 26.0 - 34.0 pg   MCHC 31.7 30.0 - 36.0 g/dL   RDW 16.1 (H) 11.5 - 15.5 %   Platelets 210 150 - 400 K/uL   Neutrophils Relative % 79 %   Neutro Abs 4.6 1.7 - 7.7 K/uL   Lymphocytes Relative 16 %   Lymphs Abs  0.9 0.7 - 4.0 K/uL   Monocytes Relative 4 %   Monocytes Absolute 0.3 0.1 - 1.0 K/uL   Eosinophils Relative 1 %   Eosinophils Absolute 0.1 0.0 - 0.7 K/uL   Basophils Relative 0 %   Basophils Absolute 0.0 0.0 - 0.1 K/uL    Comment: Performed at [facility], [address], [place], [place] [address]  Influenza panel by PCR (type A & B)     Status: None   Collection Time: 07/15/17 12:00 PM  Result Value Ref Range   Influenza A By PCR NEGATIVE NEGATIVE   Influenza B By PCR NEGATIVE NEGATIVE    Comment: (NOTE) The Xpert Xpress Flu assay is intended as an aid in the diagnosis of  influenza and should not be used as a sole basis for treatment.  This  assay is FDA approved for nasopharyngeal swab specimens only. Nasal  washings and aspirates are unacceptable for Xpert Xpress Flu testing. Performed at [facility], [address] [place], [place] [address]   Comprehensive metabolic panel     Status: Abnormal   Collection Time: 07/15/17   3:29 PM  Result Value Ref Range   Sodium 131 (L) 135 - 145 mmol/L   Potassium 3.4 (L) 3.5 - 5.1 mmol/L   Chloride 104 101 - 111 mmol/L   CO2 18 (L) 22 - 32 mmol/L   Glucose, Bld 142 (H) 65 - 99 mg/dL   BUN <5 (L) 6 - 20 mg/dL    Comment: REPEATED TO VERIFY   Creatinine, Ser 0.52 0.44 - 1.00 mg/dL   Calcium 8.2 (L) 8.9 - 10.3 mg/dL   Total Protein 5.2 (L) 6.5 - 8.1 g/dL   Albumin 2.4 (L) 3.5 - 5.0 g/dL   AST 20 15 - 41 U/L   ALT 12 (L) 14 - 54 U/L   Alkaline Phosphatase 50 38 - 126 U/L   Total Bilirubin 0.3 0.3 - 1.2 mg/dL   GFR calc non Af Amer >60 >60 mL/min   GFR calc Af Amer >60 >60 mL/min    Comment: (NOTE) The eGFR has been calculated using the CKD EPI equation. This calculation has not been validated in all clinical situations. eGFR's persistently <60 mL/min signify possible Chronic Kidney Disease.    Anion gap 9 5 - 15    Comment: Performed at [facility], [address], [place], [place] [address]  Lactic acid, plasma     Status: Abnormal   Collection Time: 07/15/17  3:41 PM  Result Value Ref Range   Lactic Acid, Venous 3.5 (HH) 0.5 - 1.9 mmol/L    Comment: CRITICAL RESULT CALLED TO, READ BACK BY AND VERIFIED WITH: NICOLE D. RN_0  ON 2.2.19 BY TCALDWELL MT. Performed at [facility], [address], [place], [place] [address]    Dg Chest 2 View  Result Date: 07/15/2017 CLINICAL DATA:  Flu-like symptoms.  Twenty-six weeks pregnant. EXAM: CHEST  2 VIEW COMPARISON:  None. FINDINGS: Cardiomediastinal silhouette is normal. Mediastinal contours appear intact. There is no evidence of focal airspace consolidation, pleural effusion or pneumothorax. Osseous structures are without acute abnormality. Soft tissues are grossly normal. IMPRESSION: No active cardiopulmonary disease. Electronically Signed   By: Fidela Salisbury M.D.   On: 07/15/2017 13:15   Mr Pelvis Wo Contrast  Result Date: 07/15/2017 CLINICAL DATA:  Abdominal pain, vomiting. Flu-like symptoms. Pt  states rt sided abd pain, pt [redacted] weeks pregnant Hx of fibroids. 3 tries to get pt into scanner for exam. Pt nervous, claustrophobic  and uncomfortable inside scanner. Best possible images. EXAM: MRI ABDOMEN AND PELVIS WITHOUT CONTRAST TECHNIQUE: Multiplanar multisequence MR imaging of the abdomen and pelvis was performed. No intravenous contrast was administered. COMPARISON:  None applicable FINDINGS: COMBINED FINDINGS FOR BOTH MR ABDOMEN AND PELVIS Lower chest: No acute findings. Hepatobiliary: No mass or other parenchymal abnormality identified. Pancreas: No mass, inflammatory changes, or other parenchymal abnormality identified. Spleen:  Within normal limits in size and appearance. Adrenals/Urinary Tract: No masses identified. No evidence of hydronephrosis. Stomach/Bowel: Stomach is normal in appearance. No small bowel dilatation. The appendix is well seen and is normal in appearance. No periappendiceal fluid. Loops of colon are grossly unremarkable. Vascular/Lymphatic: No pathologically enlarged lymph nodes identified. No abdominal aortic aneurysm demonstrated. Reproductive: Gravid uterus, fetus in cephalic presentation. Multiple uterine fibroids are present. Right lower uterine segment fibroid measures 4.3 x 4.9 centimeters. Large pedunculated posterior lower uterine segment fibroid is 7.4 x 4.6 centimeters. Additional posterior myometrial fibroids are present. The placenta is fundal and posterior. No previa. Ovaries are unremarkable in appearance. Other:  None Musculoskeletal: No suspicious bone lesions identified. IMPRESSION: 1. Normal appearance of the appendix. 2. Multiple uterine fibroids, as described. Large pedunculated lower uterine segment fibroid is 7.4 centimeters. Intramural right lower uterine segment fibroid is 4.9 centimeters. 3. Gravid uterus, with fetus in cephalic presentation. 4. No placenta previa. Electronically Signed   By: Nolon Nations M.D.   On: 07/15/2017 19:35   Mr Abdomen Wo  Contrast  Result Date: 07/15/2017 CLINICAL DATA:  Abdominal pain, vomiting. Flu-like symptoms. Pt states rt sided abd pain, pt [redacted] weeks pregnant Hx of fibroids. 3 tries to get pt into scanner for exam. Pt nervous, claustrophobic and uncomfortable inside scanner. Best possible images. EXAM: MRI ABDOMEN AND PELVIS WITHOUT CONTRAST TECHNIQUE: Multiplanar multisequence MR imaging of the abdomen and pelvis was performed. No intravenous contrast was administered. COMPARISON:  None applicable FINDINGS: COMBINED FINDINGS FOR BOTH MR ABDOMEN AND PELVIS Lower chest: No acute findings. Hepatobiliary: No mass or other parenchymal abnormality identified. Pancreas: No mass, inflammatory changes, or other parenchymal abnormality identified. Spleen:  Within normal limits in size and appearance. Adrenals/Urinary Tract: No masses identified. No evidence of hydronephrosis. Stomach/Bowel: Stomach is normal in appearance. No small bowel dilatation. The appendix is well seen and is normal in appearance. No periappendiceal fluid. Loops of colon are grossly unremarkable. Vascular/Lymphatic: No pathologically enlarged lymph nodes identified. No abdominal aortic aneurysm demonstrated. Reproductive: Gravid uterus, fetus in cephalic presentation. Multiple uterine fibroids are present. Right lower uterine segment fibroid measures 4.3 x 4.9 centimeters. Large pedunculated posterior lower uterine segment fibroid is 7.4 x 4.6 centimeters. Additional posterior myometrial fibroids are present. The placenta is fundal and posterior. No previa. Ovaries are unremarkable in appearance. Other:  None Musculoskeletal: No suspicious bone lesions identified. IMPRESSION: 1. Normal appearance of the appendix. 2. Multiple uterine fibroids, as described. Large pedunculated lower uterine segment fibroid is 7.4 centimeters. Intramural right lower uterine segment fibroid is 4.9 centimeters. 3. Gravid uterus, with fetus in cephalic presentation. 4. No placenta  previa. Electronically Signed   By: Nolon Nations M.D.   On: 07/15/2017 19:35   Review of Systems  Constitutional: Positive for chills. Negative for fever.  HENT: Positive for congestion and sore throat.   Eyes: Positive for discharge.  Respiratory: Positive for cough and wheezing.   Gastrointestinal: Positive for abdominal pain. Negative for nausea and vomiting.  Genitourinary: Negative for vaginal bleeding, vaginal discharge and vaginal pain.   Physical Exam   Blood pressure  111/63, pulse (!) 128, temperature 99 F (37.2 C), temperature source Oral, resp. rate 20, height _0  (1.626 m), weight 242 lb (109.8 kg).   Patient Vitals for the past 24 hrs:  BP Temp Temp src Pulse Resp SpO2 Height Weight  07/15/17 1500 112/63 - - 94 - 100 % - -  07/15/17 1453 (!) 98/54 - - 89 - - - -  07/15/17 1449 (!) 72/48 - - (!) 123 - - - -  07/15/17 1448 (!) 39/23 - - (!) 291 - - - -  07/15/17 1446 (!) 60/36 - - (!) 142 - - - -  07/15/17 1435 100/70 - - (!) 121 - 100 % - -  07/15/17 1305 - - - - - 100 % - -  07/15/17 1128 111/63 99 F (37.2 C) Oral (!) 128 20 - - -  07/15/17 1121 - - - - - - _1  (1.626 m) 242 lb (109.8 kg)    Physical Exam  Constitutional: She is oriented to person, place, and time. She appears well-developed and well-nourished. She has a sickly appearance. She appears ill.  Eyes: Pupils are equal, round, and reactive to light.  Neck: Neck supple.  Cardiovascular: Regular rhythm. Tachycardia present.  Respiratory: Effort normal. No accessory muscle usage. She has no decreased breath sounds. She has wheezes. She has rhonchi.  GI: Soft. Normal appearance. There is generalized tenderness. There is no rigidity, no rebound and no guarding.  Genitourinary:  Genitourinary Comments: Dilation: Closed Exam by:: Noni Saupe NP  Musculoskeletal: Normal range of motion.  Neurological: She is alert and oriented to person, place, and time.  Skin: Skin is warm.  Psychiatric: Her  behavior is normal.   Fetal Tracing: Baseline: 145 bpm Variability: Moderate  Accelerations: 15x15 Decelerations: None Toco: None  MAU Course  Procedures  None  MDM  Urine shows > 80 ketones  LR Bolus  D5 LR bolus with MV Toradol 30 mg IV CBC with diff.  Discussed patient with Dr. Matthew Saras. Will continue to monitor and fluid bolus until patient is able to get up and urinate.  Patient up to the bathroom stated she did not feel well and thought she may pass out. RN with patient the entire time while up. She vomited rapidly 5x per the RN. The patient says she felt better after vomiting. The patient was able to walk back to the room without difficulty, without dizziness.  RN called NP to the room rapidly stating the patient was diaphoretic stating she did not feel well. Bp checked at it read 60/36. Patient was placed supine, IV fluids wide open. Dr. Ilda Basset and Dr. Matthew Saras called to the bedside. Stat EKG done, showing NSR. Patient with very minimal response to ammonia and sternal rub. BP 39/23; anaesthesia called STAT to bedside. BP increasing, patient responding verbally. Discussed plan of care with Dr. Matthew Saras at bedside. MRI ordered, admit pending MRI results.  MRI results discussed with Dr. Matthew Saras. Patient on her way [facility] hospital and will be admitted for observation over night CBC with diff, Blood cultures, lactic acid once the patient arrives.   Assessment and Plan   A:  1. Abdominal pain, acute   2. Cough   3. Vomiting affecting pregnancy   4. Leiomyoma of uterus affecting pregnancy, antepartum   5. Influenza-like illness   6. Syncope, unspecified syncope type   7. Tachycardia   8. Severe dehydration     P:  Admit to 3rd floor per Dr. Matthew Saras,  Call Dr. Matthew Saras for further orders.  Tamiflu 75 mg BID X 5 days.   Lezlie Lye, NP 07/15/2017 7:48 PM

## 2017-07-15 NOTE — Progress Notes (Signed)
Dr. Matthew Saras called to come see patient at 2.  RRT called to room at 1450.

## 2017-07-15 NOTE — MAU Note (Signed)
Lab called critical value lactic acid 3.5. Informed Noni Saupe NP.

## 2017-07-15 NOTE — MAU Note (Signed)
RN into pt room due to audible alarm, maternal heartrate 124, pt denies complaints at this time except that she needs to void, pt ambulatory to restroom however once the patient sits on the toilet she states she feels dizzy. Call for assistance and wheelchair. Pt states she feels like she is going to pass out. Pt vomits x 4, clear liquid. States she feels better after getting back into bed. siderails up, call light within reach.

## 2017-07-16 DIAGNOSIS — B349 Viral infection, unspecified: Secondary | ICD-10-CM

## 2017-07-16 DIAGNOSIS — J069 Acute upper respiratory infection, unspecified: Secondary | ICD-10-CM | POA: Diagnosis present

## 2017-07-16 DIAGNOSIS — R109 Unspecified abdominal pain: Secondary | ICD-10-CM

## 2017-07-16 MED ORDER — ONDANSETRON HCL 4 MG PO TABS
8.0000 mg | ORAL_TABLET | Freq: Three times a day (TID) | ORAL | Status: DC | PRN
  Administered 2017-07-16: 8 mg via ORAL
  Filled 2017-07-16 (×2): qty 2

## 2017-07-16 MED ORDER — DEXTROSE IN LACTATED RINGERS 5 % IV SOLN
INTRAVENOUS | Status: DC
  Administered 2017-07-16 – 2017-07-17 (×3): via INTRAVENOUS

## 2017-07-16 MED ORDER — ALBUTEROL SULFATE (2.5 MG/3ML) 0.083% IN NEBU
2.5000 mg | INHALATION_SOLUTION | Freq: Four times a day (QID) | RESPIRATORY_TRACT | Status: DC | PRN
  Administered 2017-07-16 – 2017-07-17 (×3): 2.5 mg via RESPIRATORY_TRACT
  Filled 2017-07-16 (×2): qty 3

## 2017-07-16 MED ORDER — OSELTAMIVIR PHOSPHATE 75 MG PO CAPS: 75.0000 mg | ORAL_CAPSULE | Freq: Two times a day (BID) | ORAL | 0 refills | Status: DC

## 2017-07-16 NOTE — Progress Notes (Signed)
Pt requested D/C cancellation, wants to feel stronger and cont her IVF + neb tx with Resp Therapy, will resume Tamiflu , cont IV fluids and poss D/C tomorrow

## 2017-07-16 NOTE — Progress Notes (Signed)
S// feeling better today, still some upper abd pain but better w/ Yylenol  O// BP (!) 88/47 (BP Location: Right Arm)   Pulse 96   Temp 98.4 F (36.9 C) (Oral)   Resp 18   Ht 5\' 4"  (1.626 m)   Wt 242 lb (109.8 kg)   SpO2 98%   BMI 41.54 kg/m   Lungs clear Abd> fundus soft, nontender  CBC    Component Value Date/Time   WBC 5.8 07/15/2017 1200   RBC 3.84 (L) 07/15/2017 1200   HGB 9.8 (L) 07/15/2017 1200   HCT 30.9 (L) 07/15/2017 1200   PLT 210 07/15/2017 1200   MCV 80.5 07/15/2017 1200   MCH 25.5 (L) 07/15/2017 1200   MCHC 31.7 07/15/2017 1200   RDW 16.1 (H) 07/15/2017 1200   LYMPHSABS 0.9 07/15/2017 1200   MONOABS 0.3 07/15/2017 1200   EOSABS 0.1 07/15/2017 1200   BASOSABS 0.0 07/15/2017 1200    CXR and abd MRI neg yest except for documenting fibroids   Swab for type A and B flu NEG Lactic acid back to WNL this am  A+P// URI, will D/c today, finish Tamifly, ES tylenol for upper abd pain which I think is M-S pain from coughing  Has f/ u appt our office 2 days

## 2017-07-16 NOTE — Discharge Summary (Signed)
Physician Discharge Summary  Patient ID: Kristy Johnson MRN: [id number] DOB/AGE: [birth year] 42 y.o.  Admit date: 07/15/2017 Discharge date: 07/16/2017  Admission Diagnoses:1.  URI 2. ABD PAIN 3. FIBROIDS 4. [redacted]w[redacted]d   Discharge Diagnoses: SAME Active Problems:   Viral illness   Abdominal pain   URI (upper respiratory infection)   Discharged Condition: good  Hospital Course: SEEN MAU WITH URI sx and dehydration, but due to abd pain, sent to [facility] for abd /pelvic MRI>>all neg CXR neg  FLU screen NEG, adm overnight for eval , and hydration along with Tamiflu  Consults: None  Significant Diagnostic Studies: labs:  CBC    Component Value Date/Time   WBC 5.8 07/15/2017 1200   RBC 3.84 (L) 07/15/2017 1200   HGB 9.8 (L) 07/15/2017 1200   HCT 30.9 (L) 07/15/2017 1200   PLT 210 07/15/2017 1200   MCV 80.5 07/15/2017 1200   MCH 25.5 (L) 07/15/2017 1200   MCHC 31.7 07/15/2017 1200   RDW 16.1 (H) 07/15/2017 1200   LYMPHSABS 0.9 07/15/2017 1200   MONOABS 0.3 07/15/2017 1200   EOSABS 0.1 07/15/2017 1200   BASOSABS 0.0 07/15/2017 1200     Treatments: IV hydration and TAMIFLU  Discharge Exam: Blood pressure (!) 88/47, pulse 96, temperature 98.4 F (36.9 C), temperature source Oral, resp. rate 18, height 5\' 4"  (1.626 m), weight 242 lb (109.8 kg), SpO2 98 %. General appearance: alert, LUNGS CLEAR, ABD>>FUNDUS SOFT, NONTENDER  Disposition: 01-Home or Self Care   Allergies as of 07/16/2017   No Known Allergies     Medication List    TAKE these medications   DAYQUIL MULTI-SYMPTOM COLD/FLU PO Take 10 mLs by mouth daily as needed (For cold symptoms.).   folic acid 403 MCG tablet Commonly known as:  FOLVITE Take 400 mcg by mouth daily.   oseltamivir 75 MG capsule Commonly known as:  TAMIFLU Take 1 capsule (75 mg total) by mouth 2 (two) times daily.   prenatal multivitamin Tabs tablet Take 1 tablet by mouth daily at 12 noon.      Follow-up Information    [place] Follow up.   Why:  pt has appt this [place] information: [place] [address] [phone]           Signed: Margarette Asal 07/16/2017, 10:00 AM

## 2017-07-17 ENCOUNTER — Observation Stay (HOSPITAL_COMMUNITY): Payer: BLUE CROSS/BLUE SHIELD

## 2017-07-17 DIAGNOSIS — R0602 Shortness of breath: Secondary | ICD-10-CM | POA: Diagnosis not present

## 2017-07-17 LAB — CBC WITH DIFFERENTIAL/PLATELET
Basophils Absolute: 0 10*3/uL (ref 0.0–0.1)
Basophils Relative: 0 %
EOS PCT: 1 %
Eosinophils Absolute: 0 10*3/uL (ref 0.0–0.7)
HCT: 27.7 % — ABNORMAL LOW (ref 36.0–46.0)
Hemoglobin: 8.8 g/dL — ABNORMAL LOW (ref 12.0–15.0)
LYMPHS ABS: 1.4 10*3/uL (ref 0.7–4.0)
Lymphocytes Relative: 34 %
MCH: 25.7 pg — AB (ref 26.0–34.0)
MCHC: 31.8 g/dL (ref 30.0–36.0)
MCV: 80.8 fL (ref 78.0–100.0)
MONO ABS: 0.2 10*3/uL (ref 0.1–1.0)
MONOS PCT: 5 %
Neutro Abs: 2.4 10*3/uL (ref 1.7–7.7)
Neutrophils Relative %: 60 %
PLATELETS: 173 10*3/uL (ref 150–400)
RBC: 3.43 MIL/uL — AB (ref 3.87–5.11)
RDW: 16.4 % — ABNORMAL HIGH (ref 11.5–15.5)
WBC: 3.9 10*3/uL — AB (ref 4.0–10.5)

## 2017-07-17 MED ORDER — CEFTRIAXONE SODIUM 2 G IJ SOLR
2.0000 g | INTRAMUSCULAR | Status: DC
  Administered 2017-07-17: 2 g via INTRAVENOUS
  Filled 2017-07-17 (×2): qty 2

## 2017-07-17 MED ORDER — SODIUM CHLORIDE 0.9 % IV SOLN
510.0000 mg | Freq: Once | INTRAVENOUS | Status: AC
  Administered 2017-07-17: 510 mg via INTRAVENOUS
  Filled 2017-07-17: qty 17

## 2017-07-17 MED ORDER — AZITHROMYCIN 250 MG PO TABS
500.0000 mg | ORAL_TABLET | Freq: Every day | ORAL | Status: AC
  Administered 2017-07-17: 500 mg via ORAL
  Filled 2017-07-17: qty 2

## 2017-07-17 MED ORDER — AZITHROMYCIN 250 MG PO TABS
250.0000 mg | ORAL_TABLET | Freq: Every day | ORAL | Status: DC
  Administered 2017-07-18: 250 mg via ORAL
  Filled 2017-07-17: qty 1

## 2017-07-17 NOTE — Progress Notes (Signed)
Patient ID: Kristy Johnson, female   DOB: 23-Sep-1975, 42 y.o.   MRN: [id number] Pt reports feeling ill Some SOB and wheezing, which improves with Neb treatment GFM  VS 98.4-99 98.1  98.1 (36.7)  02/04 0915    Pulse Rate    88-105 87  87  02/04 0915  Resp    18-20 19  19   02/04 0915  BP    88/47-112/67 97/59  97/59  02/04 0915  SpO2 (%)    98-100 100  100  02/04 0915  FHR 140s  Lungs:  Scattered wheezed throughout Right lung Left CTA Abd Gravid nt  IUP at 28 6/7 URI sxs prob Brochitis v Pneumonia -  Repeat CBC, Cxr Will D/c tamiflu (neg Flu test and no exposure) Begin Rocephin and zithromycin Cont nebs DL

## 2017-07-17 NOTE — Progress Notes (Signed)
Patient ID: Kristy Johnson, female   DOB: Jul 22, 1975, 42 y.o.   MRN: [id number] Pt reports feeling much better and less SOB  VSSAF CBC WBC 3.9 02/04 1051 RBC 3.43 02/04 1051 Hemoglobin 8.8 02/04 1051 HCT 27.7 02/04 1051 Platelets 173 02/04 1051 CxR WNL  Lungs CTAB now  URI/pneumonia - improving clinically.  Cont Abx/Nebs.  Anticipate d/c tomorrow Anemia - long hx of this.  Desires IV Fe while here DL

## 2017-07-18 MED ORDER — AMOXICILLIN-POT CLAVULANATE 875-125 MG PO TABS: 1.0000 | ORAL_TABLET | Freq: Two times a day (BID) | ORAL | 18 refills | Status: DC

## 2017-07-18 NOTE — Progress Notes (Signed)
No complaints.  Wheezing and SOB dramatically better.  Active FM.  VSS. AF. FHT Cat I Gen: A&O x 3 Abd: soft, NT Pulm: CTAB Ext: no c/c/e  41yo [id number] at [redacted]w[redacted]d with URI/pneumonia -D/C home today as pt is clinically improved -D/C with Augmentin -F/U in office in 1 week -Anemia-s/p Fe infusion  Linda Hedges, DO

## 2017-07-18 NOTE — Discharge Instructions (Signed)
Call MD for T>100.4, shortness of breath, or other acute concern.  Call office to schedule f/u appointment in 1 week.

## 2017-07-18 NOTE — Progress Notes (Signed)
Discharge instructions reviewed with patient.  Discussed antibiotic therapy, follow up appointments and medication changes.  Patient verbalizes understanding.  Paperwork signed at this time, IV removed.

## 2017-07-18 NOTE — Discharge Summary (Signed)
Physician Discharge Summary  Patient ID: KADA FRIESEN MRN: [id number] DOB/AGE: 1975/06/14 42 y.o.  Admit date: 07/15/2017 Discharge date: 07/18/2017  Admission Diagnoses:1.  URI 2. ABD PAIN 3. FIBROIDS 4. [redacted]w[redacted]d   Discharge Diagnoses: SAME Active Problems:   URI (upper respiratory infection)   Abdominal pain   Discharged Condition: good  Hospital Course: SEEN MAU WITH URI sx and dehydration, but due to abd pain, sent to [facility] for abd /pelvic MRI>>all neg CXR neg  FLU screen NEG.  Patient was admitted for hydration, antibiotics, and nebs.  The patient was improved on HD 4 and was discharged home to complete antibiotic course. She will follow up in the office in 1 week.  Consults: None  Significant Diagnostic Studies: labs:  CBC Labs(Brief)          Component Value Date/Time   WBC 5.8 07/15/2017 1200   RBC 3.84 (L) 07/15/2017 1200   HGB 9.8 (L) 07/15/2017 1200   HCT 30.9 (L) 07/15/2017 1200   PLT 210 07/15/2017 1200   MCV 80.5 07/15/2017 1200   MCH 25.5 (L) 07/15/2017 1200   MCHC 31.7 07/15/2017 1200   RDW 16.1 (H) 07/15/2017 1200   LYMPHSABS 0.9 07/15/2017 1200   MONOABS 0.3 07/15/2017 1200   EOSABS 0.1 07/15/2017 1200   BASOSABS 0.0 07/15/2017 1200       Treatments: IV hydration and TAMIFLU  Discharge Exam: VSS. AF. General appearance: alert, LUNGS CLEAR, ABD>>FUNDUS SOFT, NONTENDER  Disposition: 01-Home or Self Care  Allergies as of 07/16/2017   No Known Allergies             Medication List     TAKE these medications   DAYQUIL MULTI-SYMPTOM COLD/FLU PO Take 10 mLs by mouth daily as needed (For cold symptoms.).   folic acid 151 MCG tablet Commonly known as:  FOLVITE Take 400 mcg by mouth daily.   Augmentin 875 po BID   prenatal multivitamin Tabs tablet Take 1 tablet by mouth daily at 12 noon.

## 2017-07-21 LAB — CULTURE, BLOOD (ROUTINE X 2)
Culture: NO GROWTH
Culture: NO GROWTH
SPECIAL REQUESTS: ADEQUATE
Special Requests: ADEQUATE

## 2017-07-27 ENCOUNTER — Other Ambulatory Visit (HOSPITAL_COMMUNITY): Payer: Self-pay

## 2017-07-28 ENCOUNTER — Ambulatory Visit (HOSPITAL_COMMUNITY)
Admission: RE | Admit: 2017-07-28 | Discharge: 2017-07-28 | Disposition: A | Discharge status: Home / Self Care | Payer: BLUE CROSS/BLUE SHIELD | Source: Ambulatory Visit | Attending: Obstetrics and Gynecology | Admitting: Obstetrics and Gynecology

## 2017-07-28 DIAGNOSIS — D649 Anemia, unspecified: Secondary | ICD-10-CM | POA: Insufficient documentation

## 2017-07-28 DIAGNOSIS — O99019 Anemia complicating pregnancy, unspecified trimester: Secondary | ICD-10-CM | POA: Diagnosis not present

## 2017-07-28 MED ORDER — FERUMOXYTOL INJECTION 510 MG/17 ML
510.0000 mg | Freq: Once | INTRAVENOUS | Status: AC
  Administered 2017-07-28: 510 mg via INTRAVENOUS
  Filled 2017-07-28: qty 17

## 2017-08-03 DIAGNOSIS — Z3480 Encounter for supervision of other normal pregnancy, unspecified trimester: Secondary | ICD-10-CM | POA: Diagnosis not present

## 2017-08-03 DIAGNOSIS — O3413 Maternal care for benign tumor of corpus uteri, third trimester: Secondary | ICD-10-CM | POA: Diagnosis not present

## 2017-08-03 DIAGNOSIS — Z3A31 31 weeks gestation of pregnancy: Secondary | ICD-10-CM | POA: Diagnosis not present

## 2017-08-31 DIAGNOSIS — Z3685 Encounter for antenatal screening for Streptococcus B: Secondary | ICD-10-CM | POA: Diagnosis not present

## 2017-08-31 DIAGNOSIS — Z3480 Encounter for supervision of other normal pregnancy, unspecified trimester: Secondary | ICD-10-CM | POA: Diagnosis not present

## 2017-08-31 LAB — OB RESULTS CONSOLE GBS: STREP GROUP B AG: NEGATIVE

## 2017-09-08 DIAGNOSIS — Z3688 Encounter for antenatal screening for fetal macrosomia: Secondary | ICD-10-CM | POA: Diagnosis not present

## 2017-09-08 DIAGNOSIS — Z3A36 36 weeks gestation of pregnancy: Secondary | ICD-10-CM | POA: Diagnosis not present

## 2017-09-14 DIAGNOSIS — O9989 Other specified diseases and conditions complicating pregnancy, childbirth and the puerperium: Secondary | ICD-10-CM | POA: Diagnosis not present

## 2017-09-14 DIAGNOSIS — Z3A37 37 weeks gestation of pregnancy: Secondary | ICD-10-CM | POA: Diagnosis not present

## 2017-09-21 DIAGNOSIS — O3413 Maternal care for benign tumor of corpus uteri, third trimester: Secondary | ICD-10-CM | POA: Diagnosis not present

## 2017-09-21 DIAGNOSIS — Z3A38 38 weeks gestation of pregnancy: Secondary | ICD-10-CM | POA: Diagnosis not present

## 2017-09-25 ENCOUNTER — Other Ambulatory Visit: Payer: Self-pay | Admitting: Obstetrics and Gynecology

## 2017-09-25 ENCOUNTER — Telehealth (HOSPITAL_COMMUNITY): Payer: Self-pay | Admitting: *Deleted

## 2017-09-25 ENCOUNTER — Encounter (HOSPITAL_COMMUNITY): Payer: Self-pay | Admitting: *Deleted

## 2017-09-25 DIAGNOSIS — N6310 Unspecified lump in the right breast, unspecified quadrant: Secondary | ICD-10-CM

## 2017-09-25 NOTE — Telephone Encounter (Signed)
Preadmission screen  

## 2017-09-27 ENCOUNTER — Ambulatory Visit
Admission: RE | Admit: 2017-09-27 | Discharge: 2017-09-27 | Disposition: A | Discharge status: Home / Self Care | Payer: BLUE CROSS/BLUE SHIELD | Source: Ambulatory Visit | Attending: Obstetrics and Gynecology | Admitting: Obstetrics and Gynecology

## 2017-09-27 ENCOUNTER — Other Ambulatory Visit: Payer: Self-pay | Admitting: Obstetrics and Gynecology

## 2017-09-27 DIAGNOSIS — N6312 Unspecified lump in the right breast, upper inner quadrant: Secondary | ICD-10-CM | POA: Diagnosis not present

## 2017-09-27 DIAGNOSIS — D241 Benign neoplasm of right breast: Secondary | ICD-10-CM | POA: Diagnosis not present

## 2017-09-27 DIAGNOSIS — N6310 Unspecified lump in the right breast, unspecified quadrant: Secondary | ICD-10-CM

## 2017-09-28 NOTE — H&P (Deleted)
  The note originally documented on this encounter has been moved the the encounter in which it belongs.  

## 2017-09-28 NOTE — H&P (Signed)
Kristy Johnson, Kristy Johnson               ACCOUNT NO.:  [id number]  MEDICAL RECORD NO.:  [id number]  LOCATION:                                 FACILITY:  PHYSICIAN:  Ralene Bathe. Matthew Saras, M.D.    DATE OF BIRTH:  DATE OF ADMISSION: DATE OF DISCHARGE:                             HISTORY & PHYSICAL   CHIEF COMPLAINT:  For labor induction at term, also requests postpartum tubal ligation.  HISTORY OF PRESENT ILLNESS:  A 42 year old, G3, P2; EDD, October 05, 2017. She has had a normal panorama screen with this pregnancy.  Presents at term for two-stage labor induction.  Her 1-hour GTT was normal at 114. GBS was negative.  Latest ultrasound, September 21, 2017, showed vertex, EFW 6 pounds and 0 ounces, 20th percentile.  She is known to have several fibroids, the largest 5.3 x 5 cm.  She has had anemia, most recently has had IV iron infusion, and procedure of Cytotec followed by AROM Pitocin for labor induction discussed with her.  Also, she requests postpartum tubal ligation, the permanence of the procedure, failure rate of 2 to 08/998.  Reviewed with her other risks associated with bleeding, infection, other complications, and may require additional surgery discussed, which she understands and accepts.  PAST MEDICAL HISTORY:  ALLERGIES:  None.  OBSTETRICAL HISTORY:  Two vaginal deliveries in 2007 and in 2010.  For the remainder of the [place] form, family and social history, please see the [place] form for details.  PHYSICAL EXAMINATION:  VITAL SIGNS:  Temp 98.2, blood pressure 120/78. HEENT:  Unremarkable. NECK:  Supple without masses. LUNGS:  Clear. CARDIOVASCULAR:  Regular rate and rhythm without murmurs, rubs, or gallops. BREASTS:  Not examined.  Term fundal height.  Fetal heart rate 140. Cervix was fingertip, mid position, vertex.  Membranes intact. EXTREMITIES:  Unremarkable. NEUROLOGIC:  Unremarkable.  IMPRESSION:  Term pregnancy, history of leiomyoma, average estimated fetal  weight.  PLAN:  Two-stage labor induction, followed by postpartum tubal ligation. Procedure reviewed as above.     Marjarie Irion M. Matthew Saras, M.D.   ______________________________ Ralene Bathe. Matthew Saras, M.D.    RM[facility]/MEDQ  D:  09/27/2017  T:  09/28/2017  Job:  [id number]

## 2017-09-29 ENCOUNTER — Encounter (HOSPITAL_COMMUNITY): Payer: Self-pay

## 2017-09-29 ENCOUNTER — Inpatient Hospital Stay (HOSPITAL_COMMUNITY): Payer: BLUE CROSS/BLUE SHIELD | Admitting: Anesthesiology

## 2017-09-29 ENCOUNTER — Inpatient Hospital Stay (HOSPITAL_COMMUNITY)
Admission: RE | Admit: 2017-09-29 | Discharge: 2017-10-01 | DRG: 798 | Disposition: A | Discharge status: Home / Self Care | Payer: BLUE CROSS/BLUE SHIELD | Source: Ambulatory Visit | Attending: Obstetrics and Gynecology | Admitting: Obstetrics and Gynecology

## 2017-09-29 DIAGNOSIS — O3413 Maternal care for benign tumor of corpus uteri, third trimester: Secondary | ICD-10-CM | POA: Diagnosis not present

## 2017-09-29 DIAGNOSIS — Z23 Encounter for immunization: Secondary | ICD-10-CM | POA: Diagnosis not present

## 2017-09-29 DIAGNOSIS — Z3A39 39 weeks gestation of pregnancy: Secondary | ICD-10-CM

## 2017-09-29 DIAGNOSIS — O99214 Obesity complicating childbirth: Secondary | ICD-10-CM | POA: Diagnosis not present

## 2017-09-29 DIAGNOSIS — O9902 Anemia complicating childbirth: Secondary | ICD-10-CM | POA: Diagnosis not present

## 2017-09-29 DIAGNOSIS — Z8489 Family history of other specified conditions: Secondary | ICD-10-CM | POA: Diagnosis not present

## 2017-09-29 DIAGNOSIS — Z832 Family history of diseases of the blood and blood-forming organs and certain disorders involving the immune mechanism: Secondary | ICD-10-CM | POA: Diagnosis not present

## 2017-09-29 DIAGNOSIS — N838 Other noninflammatory disorders of ovary, fallopian tube and broad ligament: Secondary | ICD-10-CM | POA: Diagnosis not present

## 2017-09-29 DIAGNOSIS — D259 Leiomyoma of uterus, unspecified: Secondary | ICD-10-CM | POA: Diagnosis present

## 2017-09-29 DIAGNOSIS — D649 Anemia, unspecified: Secondary | ICD-10-CM | POA: Diagnosis not present

## 2017-09-29 DIAGNOSIS — Z302 Encounter for sterilization: Secondary | ICD-10-CM | POA: Diagnosis not present

## 2017-09-29 DIAGNOSIS — Z3490 Encounter for supervision of normal pregnancy, unspecified, unspecified trimester: Secondary | ICD-10-CM

## 2017-09-29 LAB — CBC
HCT: 33.3 % — ABNORMAL LOW (ref 36.0–46.0)
HEMOGLOBIN: 11.1 g/dL — AB (ref 12.0–15.0)
MCH: 28.8 pg (ref 26.0–34.0)
MCHC: 33.3 g/dL (ref 30.0–36.0)
MCV: 86.5 fL (ref 78.0–100.0)
Platelets: 191 10*3/uL (ref 150–400)
RBC: 3.85 MIL/uL — AB (ref 3.87–5.11)
RDW: 17.6 % — ABNORMAL HIGH (ref 11.5–15.5)
WBC: 6.1 10*3/uL (ref 4.0–10.5)

## 2017-09-29 LAB — TYPE AND SCREEN
ABO/RH(D): A POS
ANTIBODY SCREEN: NEGATIVE

## 2017-09-29 MED ORDER — ONDANSETRON HCL 4 MG/2ML IJ SOLN: 4.0000 mg | Freq: Four times a day (QID) | INTRAMUSCULAR | Status: DC | PRN

## 2017-09-29 MED ORDER — OXYTOCIN 40 UNITS IN LACTATED RINGERS INFUSION - SIMPLE MED
2.5000 [IU]/h | INTRAVENOUS | Status: DC
  Filled 2017-09-29: qty 1000

## 2017-09-29 MED ORDER — OXYCODONE-ACETAMINOPHEN 5-325 MG PO TABS: 1.0000 | ORAL_TABLET | ORAL | Status: DC | PRN

## 2017-09-29 MED ORDER — MISOPROSTOL 25 MCG QUARTER TABLET
25.0000 ug | ORAL_TABLET | ORAL | Status: DC | PRN
  Administered 2017-09-29: 25 ug via VAGINAL
  Filled 2017-09-29 (×2): qty 1

## 2017-09-29 MED ORDER — TERBUTALINE SULFATE 1 MG/ML IJ SOLN
0.2500 mg | Freq: Once | INTRAMUSCULAR | Status: AC | PRN
  Administered 2017-09-29: 0.25 mg via SUBCUTANEOUS
  Filled 2017-09-29: qty 1

## 2017-09-29 MED ORDER — FENTANYL 2.5 MCG/ML BUPIVACAINE 1/10 % EPIDURAL INFUSION (WH - ANES)
14.0000 mL/h | INTRAMUSCULAR | Status: DC | PRN
  Administered 2017-09-29: 14 mL/h via EPIDURAL
  Filled 2017-09-29 (×3): qty 100

## 2017-09-29 MED ORDER — EPHEDRINE 5 MG/ML INJ
10.0000 mg | INTRAVENOUS | Status: DC | PRN
  Filled 2017-09-29: qty 2

## 2017-09-29 MED ORDER — OXYCODONE-ACETAMINOPHEN 5-325 MG PO TABS: 2.0000 | ORAL_TABLET | ORAL | Status: DC | PRN

## 2017-09-29 MED ORDER — ACETAMINOPHEN 325 MG PO TABS: 650.0000 mg | ORAL_TABLET | ORAL | Status: DC | PRN

## 2017-09-29 MED ORDER — LIDOCAINE HCL (PF) 1 % IJ SOLN
30.0000 mL | INTRAMUSCULAR | Status: DC | PRN
  Filled 2017-09-29: qty 30

## 2017-09-29 MED ORDER — LACTATED RINGERS IV SOLN
500.0000 mL | Freq: Once | INTRAVENOUS | Status: AC
  Administered 2017-09-29: 500 mL via INTRAVENOUS

## 2017-09-29 MED ORDER — FENTANYL 2.5 MCG/ML BUPIVACAINE 1/10 % EPIDURAL INFUSION (WH - ANES)
14.0000 mL/h | INTRAMUSCULAR | Status: DC | PRN
  Administered 2017-09-29 (×3): 14 mL/h via EPIDURAL

## 2017-09-29 MED ORDER — SOD CITRATE-CITRIC ACID 500-334 MG/5ML PO SOLN: 30.0000 mL | ORAL | Status: DC | PRN

## 2017-09-29 MED ORDER — LACTATED RINGERS IV SOLN
500.0000 mL | INTRAVENOUS | Status: DC | PRN
  Administered 2017-09-29: 500 mL via INTRAVENOUS

## 2017-09-29 MED ORDER — FLEET ENEMA 7-19 GM/118ML RE ENEM: 1.0000 | ENEMA | RECTAL | Status: DC | PRN

## 2017-09-29 MED ORDER — LACTATED RINGERS IV SOLN
INTRAVENOUS | Status: DC
  Administered 2017-09-29: 22:00:00 via INTRAUTERINE

## 2017-09-29 MED ORDER — DIPHENHYDRAMINE HCL 50 MG/ML IJ SOLN: 12.5000 mg | INTRAMUSCULAR | Status: DC | PRN

## 2017-09-29 MED ORDER — PHENYLEPHRINE 40 MCG/ML (10ML) SYRINGE FOR IV PUSH (FOR BLOOD PRESSURE SUPPORT)
80.0000 ug | PREFILLED_SYRINGE | INTRAVENOUS | Status: DC | PRN
  Filled 2017-09-29: qty 5
  Filled 2017-09-29: qty 10

## 2017-09-29 MED ORDER — LACTATED RINGERS IV SOLN
INTRAVENOUS | Status: DC
  Administered 2017-09-29 (×4): via INTRAVENOUS

## 2017-09-29 MED ORDER — OXYTOCIN 40 UNITS IN LACTATED RINGERS INFUSION - SIMPLE MED
1.0000 m[IU]/min | INTRAVENOUS | Status: DC
  Administered 2017-09-29: 1 m[IU]/min via INTRAVENOUS

## 2017-09-29 MED ORDER — TERBUTALINE SULFATE 1 MG/ML IJ SOLN
0.2500 mg | Freq: Once | INTRAMUSCULAR | Status: DC | PRN
  Filled 2017-09-29: qty 1

## 2017-09-29 MED ORDER — OXYTOCIN BOLUS FROM INFUSION
500.0000 mL | Freq: Once | INTRAVENOUS | Status: AC
  Administered 2017-09-30: 500 mL via INTRAVENOUS

## 2017-09-29 MED ORDER — PHENYLEPHRINE 40 MCG/ML (10ML) SYRINGE FOR IV PUSH (FOR BLOOD PRESSURE SUPPORT)
80.0000 ug | PREFILLED_SYRINGE | INTRAVENOUS | Status: DC | PRN
  Filled 2017-09-29: qty 5

## 2017-09-29 MED ORDER — LIDOCAINE HCL (PF) 1 % IJ SOLN
INTRAMUSCULAR | Status: DC | PRN
  Administered 2017-09-29 (×2): 4 mL

## 2017-09-29 NOTE — Progress Notes (Signed)
comtt w/ epid, 3/80/-2>>IUPC + ISE, cont pit aug, clear AF

## 2017-09-29 NOTE — Anesthesia Pain Management Evaluation Note (Signed)
  CRNA Pain Management Visit Note  Patient: Kristy Johnson, 42 y.o., female  "Hello I am a member of the anesthesia team at [facility]. We have an anesthesia team available at all times to provide care throughout the hospital, including epidural management and anesthesia for C-section. I don't know your plan for the delivery whether it a natural birth, water birth, IV sedation, nitrous supplementation, doula or epidural, but we want to meet your pain goals."   1.Was your pain managed to your expectations on prior hospitalizations?   Yes   2.What is your expectation for pain management during this hospitalization?     Epidural  3.How can we help you reach that goal? unsure  Record the patient's initial score and the patient's pain goal.   Pain: 0  Pain Goal: 7 [facility] wants you to be able to say your pain was always managed very well.  Casimer Lanius 09/29/2017

## 2017-09-29 NOTE — Progress Notes (Signed)
cx closed/vtx, but ctx q 3-4, if ctx space out>>>pit protocol

## 2017-09-29 NOTE — Anesthesia Procedure Notes (Signed)
Epidural Patient location during procedure: OB Start time: 09/29/2017 6:35 AM End time: 09/29/2017 6:45 AM  Staffing Anesthesiologist: Nolon Nations, MD Performed: anesthesiologist   Preanesthetic Checklist Completed: patient identified, pre-op evaluation, timeout performed, IV checked, risks and benefits discussed and monitors and equipment checked  Epidural Patient position: sitting Prep: ChloraPrep, site prepped and draped and DuraPrep Patient monitoring: blood pressure, continuous pulse ox and heart rate Approach: midline Location: L3-L4 Injection technique: LOR saline  Needle:  Needle type: Tuohy  Needle gauge: 17 G Needle length: 9 cm Needle insertion depth: 9 cm Catheter type: closed end flexible Catheter size: 19 Gauge Catheter at skin depth: 14 cm Test dose: negative  Assessment Sensory level: T8 Events: blood not aspirated, injection not painful, no injection resistance, negative IV test and no paresthesia  Additional Notes  Reason for block:procedure for pain

## 2017-09-29 NOTE — Anesthesia Preprocedure Evaluation (Signed)
Anesthesia Evaluation  Patient identified by MRN, date of birth, ID band Patient awake    Reviewed: Allergy & Precautions, NPO status , Patient's Chart, lab work & pertinent test results  Airway Mallampati: II  TM Distance: >3 FB Neck ROM: Full    Dental no notable dental hx.    Pulmonary neg pulmonary ROS,    Pulmonary exam normal breath sounds clear to auscultation       Cardiovascular negative cardio ROS Normal cardiovascular exam Rhythm:Regular Rate:Normal     Neuro/Psych  Headaches, negative psych ROS   GI/Hepatic negative GI ROS, Neg liver ROS,   Endo/Other  Morbid obesity  Renal/GU negative Renal ROS     Musculoskeletal negative musculoskeletal ROS (+)   Abdominal   Peds  Hematology negative hematology ROS (+) anemia ,   Anesthesia Other Findings   Reproductive/Obstetrics (+) Pregnancy                             Anesthesia Physical Anesthesia Plan  ASA: III  Anesthesia Plan: Epidural   Post-op Pain Management:    Induction:   PONV Risk Score and Plan:   Airway Management Planned:   Additional Equipment:   Intra-op Plan:   Post-operative Plan:   Informed Consent: I have reviewed the patients History and Physical, chart, labs and discussed the procedure including the risks, benefits and alternatives for the proposed anesthesia with the patient or authorized representative who has indicated his/her understanding and acceptance.     Plan Discussed with:   Anesthesia Plan Comments:         Anesthesia Quick Evaluation

## 2017-09-29 NOTE — Progress Notes (Signed)
FT/post/vtx , -3, ctx q 3-4 spont, not on pit, latest EFM no decels w/ ctx, will cont to obsv labor pattern

## 2017-09-29 NOTE — Progress Notes (Signed)
On low dose pit, stable FHR, ISE for AROM>>>clear, cx 1/25/vtx

## 2017-09-30 ENCOUNTER — Inpatient Hospital Stay (HOSPITAL_COMMUNITY): Payer: BLUE CROSS/BLUE SHIELD | Admitting: Anesthesiology

## 2017-09-30 ENCOUNTER — Ambulatory Visit (HOSPITAL_COMMUNITY)
Admission: RE | Admit: 2017-09-30 | Payer: BLUE CROSS/BLUE SHIELD | Source: Ambulatory Visit | Admitting: Obstetrics and Gynecology

## 2017-09-30 ENCOUNTER — Encounter (HOSPITAL_COMMUNITY): Payer: Self-pay

## 2017-09-30 ENCOUNTER — Encounter (HOSPITAL_COMMUNITY)
Admission: RE | Disposition: A | Discharge status: Home / Self Care | Payer: Self-pay | Source: Ambulatory Visit | Attending: Obstetrics and Gynecology

## 2017-09-30 HISTORY — PX: TUBAL LIGATION: SHX77

## 2017-09-30 LAB — CBC
HEMATOCRIT: 34.7 % — AB (ref 36.0–46.0)
HEMOGLOBIN: 11.6 g/dL — AB (ref 12.0–15.0)
MCH: 28.8 pg (ref 26.0–34.0)
MCHC: 33.4 g/dL (ref 30.0–36.0)
MCV: 86.1 fL (ref 78.0–100.0)
PLATELETS: 174 10*3/uL (ref 150–400)
RBC: 4.03 MIL/uL (ref 3.87–5.11)
RDW: 17.4 % — ABNORMAL HIGH (ref 11.5–15.5)
WBC: 11.8 10*3/uL — AB (ref 4.0–10.5)

## 2017-09-30 LAB — TYPE AND SCREEN
ABO/RH(D): A POS
Antibody Screen: NEGATIVE

## 2017-09-30 LAB — RPR: RPR: NONREACTIVE

## 2017-09-30 LAB — HIV ANTIBODY (ROUTINE TESTING W REFLEX): HIV SCREEN 4TH GENERATION: NONREACTIVE

## 2017-09-30 SURGERY — LIGATION, FALLOPIAN TUBE, POSTPARTUM
Anesthesia: Epidural | Laterality: Bilateral

## 2017-09-30 MED ORDER — BUPIVACAINE HCL (PF) 0.25 % IJ SOLN
INTRAMUSCULAR | Status: DC | PRN
  Administered 2017-09-30: 10 mL

## 2017-09-30 MED ORDER — PROPOFOL 10 MG/ML IV BOLUS
INTRAVENOUS | Status: AC
  Filled 2017-09-30: qty 20

## 2017-09-30 MED ORDER — SIMETHICONE 80 MG PO CHEW: 80.0000 mg | CHEWABLE_TABLET | ORAL | Status: DC | PRN

## 2017-09-30 MED ORDER — BISACODYL 10 MG RE SUPP: 10.0000 mg | Freq: Every day | RECTAL | Status: DC | PRN

## 2017-09-30 MED ORDER — DIPHENHYDRAMINE HCL 25 MG PO CAPS: 25.0000 mg | ORAL_CAPSULE | Freq: Four times a day (QID) | ORAL | Status: DC | PRN

## 2017-09-30 MED ORDER — LACTATED RINGERS IV SOLN
INTRAVENOUS | Status: DC
  Administered 2017-09-30: 07:00:00 via INTRAVENOUS

## 2017-09-30 MED ORDER — OXYCODONE-ACETAMINOPHEN 5-325 MG PO TABS: 1.0000 | ORAL_TABLET | ORAL | Status: DC | PRN

## 2017-09-30 MED ORDER — BENZOCAINE-MENTHOL 20-0.5 % EX AERO: 1.0000 "application " | INHALATION_SPRAY | CUTANEOUS | Status: DC | PRN

## 2017-09-30 MED ORDER — FENTANYL CITRATE (PF) 100 MCG/2ML IJ SOLN
INTRAMUSCULAR | Status: DC | PRN
  Administered 2017-09-30 (×2): 50 ug via INTRAVENOUS

## 2017-09-30 MED ORDER — TETANUS-DIPHTH-ACELL PERTUSSIS 5-2.5-18.5 LF-MCG/0.5 IM SUSP: 0.5000 mL | Freq: Once | INTRAMUSCULAR | Status: DC

## 2017-09-30 MED ORDER — MEASLES, MUMPS & RUBELLA VAC ~~LOC~~ INJ: 0.5000 mL | INJECTION | Freq: Once | SUBCUTANEOUS | Status: DC

## 2017-09-30 MED ORDER — WITCH HAZEL-GLYCERIN EX PADS: 1.0000 "application " | MEDICATED_PAD | CUTANEOUS | Status: DC | PRN

## 2017-09-30 MED ORDER — ZOLPIDEM TARTRATE 5 MG PO TABS: 5.0000 mg | ORAL_TABLET | Freq: Every evening | ORAL | Status: DC | PRN

## 2017-09-30 MED ORDER — ONDANSETRON HCL 4 MG/2ML IJ SOLN: 4.0000 mg | INTRAMUSCULAR | Status: DC | PRN

## 2017-09-30 MED ORDER — OXYCODONE-ACETAMINOPHEN 5-325 MG PO TABS: 2.0000 | ORAL_TABLET | ORAL | Status: DC | PRN

## 2017-09-30 MED ORDER — PRENATAL MULTIVITAMIN CH: 1.0000 | ORAL_TABLET | Freq: Every day | ORAL | Status: DC

## 2017-09-30 MED ORDER — LIDOCAINE-EPINEPHRINE (PF) 2 %-1:200000 IJ SOLN
INTRAMUSCULAR | Status: AC
  Filled 2017-09-30: qty 20

## 2017-09-30 MED ORDER — METOCLOPRAMIDE HCL 10 MG PO TABS
10.0000 mg | ORAL_TABLET | Freq: Once | ORAL | Status: AC
  Administered 2017-09-30: 10 mg via ORAL
  Filled 2017-09-30: qty 1

## 2017-09-30 MED ORDER — KETOROLAC TROMETHAMINE 30 MG/ML IJ SOLN
INTRAMUSCULAR | Status: AC
  Filled 2017-09-30: qty 1

## 2017-09-30 MED ORDER — IBUPROFEN 800 MG PO TABS
800.0000 mg | ORAL_TABLET | Freq: Three times a day (TID) | ORAL | Status: DC | PRN
  Administered 2017-09-30 – 2017-10-01 (×2): 800 mg via ORAL
  Filled 2017-09-30 (×2): qty 1

## 2017-09-30 MED ORDER — ONDANSETRON HCL 4 MG PO TABS: 4.0000 mg | ORAL_TABLET | ORAL | Status: DC | PRN

## 2017-09-30 MED ORDER — ACETAMINOPHEN 325 MG PO TABS
650.0000 mg | ORAL_TABLET | ORAL | Status: DC | PRN
  Administered 2017-09-30 – 2017-10-01 (×2): 650 mg via ORAL
  Filled 2017-09-30 (×2): qty 2

## 2017-09-30 MED ORDER — FLEET ENEMA 7-19 GM/118ML RE ENEM: 1.0000 | ENEMA | Freq: Every day | RECTAL | Status: DC | PRN

## 2017-09-30 MED ORDER — ONDANSETRON HCL 4 MG/2ML IJ SOLN
INTRAMUSCULAR | Status: AC
  Filled 2017-09-30: qty 2

## 2017-09-30 MED ORDER — IBUPROFEN 800 MG PO TABS: 800.0000 mg | ORAL_TABLET | Freq: Three times a day (TID) | ORAL | Status: DC | PRN

## 2017-09-30 MED ORDER — SENNOSIDES-DOCUSATE SODIUM 8.6-50 MG PO TABS: 2.0000 | ORAL_TABLET | ORAL | Status: DC

## 2017-09-30 MED ORDER — ACETAMINOPHEN 325 MG PO TABS: 650.0000 mg | ORAL_TABLET | ORAL | Status: DC | PRN

## 2017-09-30 MED ORDER — KETOROLAC TROMETHAMINE 30 MG/ML IJ SOLN
INTRAMUSCULAR | Status: DC | PRN
  Administered 2017-09-30: 30 mg via INTRAVENOUS

## 2017-09-30 MED ORDER — COCONUT OIL OIL
1.0000 "application " | TOPICAL_OIL | Status: DC | PRN
  Filled 2017-09-30: qty 120

## 2017-09-30 MED ORDER — FENTANYL CITRATE (PF) 100 MCG/2ML IJ SOLN
INTRAMUSCULAR | Status: AC
  Filled 2017-09-30: qty 2

## 2017-09-30 MED ORDER — DIBUCAINE 1 % RE OINT: 1.0000 "application " | TOPICAL_OINTMENT | RECTAL | Status: DC | PRN

## 2017-09-30 MED ORDER — COCONUT OIL OIL: 1.0000 "application " | TOPICAL_OIL | Status: DC | PRN

## 2017-09-30 MED ORDER — SENNOSIDES-DOCUSATE SODIUM 8.6-50 MG PO TABS
2.0000 | ORAL_TABLET | ORAL | Status: DC
  Administered 2017-10-01 (×2): 2 via ORAL
  Filled 2017-09-30 (×2): qty 2

## 2017-09-30 MED ORDER — SODIUM CHLORIDE 0.9 % IR SOLN
Status: DC | PRN
  Administered 2017-09-30: 1

## 2017-09-30 MED ORDER — FAMOTIDINE 20 MG PO TABS
40.0000 mg | ORAL_TABLET | Freq: Once | ORAL | Status: AC
  Administered 2017-09-30: 40 mg via ORAL
  Filled 2017-09-30: qty 2

## 2017-09-30 MED ORDER — PROPOFOL 500 MG/50ML IV EMUL
INTRAVENOUS | Status: DC | PRN
  Administered 2017-09-30: 20 mg via INTRAVENOUS

## 2017-09-30 MED ORDER — LIDOCAINE-EPINEPHRINE (PF) 2 %-1:200000 IJ SOLN
INTRAMUSCULAR | Status: DC | PRN
  Administered 2017-09-30: 5 mL via INTRADERMAL
  Administered 2017-09-30: 7 mL via INTRADERMAL
  Administered 2017-09-30: 3 mL via INTRADERMAL
  Administered 2017-09-30: 5 mL via INTRADERMAL

## 2017-09-30 MED ORDER — BUPIVACAINE HCL (PF) 0.25 % IJ SOLN
INTRAMUSCULAR | Status: AC
  Filled 2017-09-30: qty 30

## 2017-09-30 MED ORDER — ONDANSETRON HCL 4 MG/2ML IJ SOLN
INTRAMUSCULAR | Status: DC | PRN
  Administered 2017-09-30: 4 mg via INTRAVENOUS

## 2017-09-30 MED ORDER — PRENATAL MULTIVITAMIN CH
1.0000 | ORAL_TABLET | Freq: Every day | ORAL | Status: DC
  Administered 2017-09-30: 1 via ORAL
  Filled 2017-09-30: qty 1

## 2017-09-30 SURGICAL SUPPLY — 24 items
CLIP FILSHIE TUBAL LIGA STRL (Clip) ×2 IMPLANT
CLOTH BEACON ORANGE TIMEOUT ST (SAFETY) ×2 IMPLANT
DRSG OPSITE POSTOP 3X4 (GAUZE/BANDAGES/DRESSINGS) ×2 IMPLANT
DRSG OPSITE POSTOP 4X10 (GAUZE/BANDAGES/DRESSINGS) ×1 IMPLANT
ELECT REM PT RETURN 9FT ADLT (ELECTROSURGICAL) ×2
ELECTRODE REM PT RTRN 9FT ADLT (ELECTROSURGICAL) ×1 IMPLANT
GLOVE BIO SURGEON STRL SZ7 (GLOVE) ×2 IMPLANT
GLOVE BIOGEL PI IND STRL 7.0 (GLOVE) ×1 IMPLANT
GLOVE BIOGEL PI INDICATOR 7.0 (GLOVE) ×1
GOWN STRL REUS W/TWL LRG LVL3 (GOWN DISPOSABLE) ×4 IMPLANT
NEEDLE HYPO 22GX1.5 SAFETY (NEEDLE) IMPLANT
NS IRRIG 1000ML POUR BTL (IV SOLUTION) ×2 IMPLANT
PACK ABDOMINAL MINOR (CUSTOM PROCEDURE TRAY) ×2 IMPLANT
PENCIL BUTTON HOLSTER BLD 10FT (ELECTRODE) ×2 IMPLANT
PROTECTOR NERVE ULNAR (MISCELLANEOUS) ×2 IMPLANT
SPONGE LAP 4X18 X RAY DECT (DISPOSABLE) IMPLANT
SUT MON AB 4-0 PS1 27 (SUTURE) ×1 IMPLANT
SUT PLAIN 0 NONE (SUTURE) ×1 IMPLANT
SUT VIC AB 2-0 UR6 27 (SUTURE) ×2 IMPLANT
SUT VICRYL 4-0 PS2 18IN ABS (SUTURE) ×2 IMPLANT
SYR CONTROL 10ML LL (SYRINGE) IMPLANT
TOWEL OR 17X24 6PK STRL BLUE (TOWEL DISPOSABLE) ×4 IMPLANT
TRAY FOLEY CATH SILVER 14FR (SET/KITS/TRAYS/PACK) ×2 IMPLANT
WATER STERILE IRR 1000ML POUR (IV SOLUTION) ×2 IMPLANT

## 2017-09-30 NOTE — Progress Notes (Signed)
Parent request formula to supplement breast feeding due to mother request, not seeing "milk"Parents have been informed of small tummy size of newborn, taught hand expression and understands the possible consequences of formula to the health of the infant. The possible consequences shared with patient include 1) Loss of confidence in breastfeeding 2) Engorgement 3) Allergic sensitization of baby(asthma/allergies) and 4) decreased milk supply for mother.After discussion of the above the mother decided to supplement with formula. The tool used to give formula supplement will be bottle with slow flow nipple.

## 2017-09-30 NOTE — Lactation Note (Signed)
This note was copied from a baby's chart. Lactation Consultation Note  Patient Name: Kristy Johnson [id number] Date: 09/30/2017 Reason for consult: Initial assessment;Term;Infant < 6lbs  16 hours old < 6 lbs FT female who is being partially BF and formula fed by her mother, that was her feeding choice upon admission. She's a P3 and somehow experienced with BF, she was able to BF her other children for 3 months. Per mom feedings at the breast are comfortable and she can hear baby swallowing but her nipples are getting a little sore. Requested her RN to bring some coconut oil. Reviewed treatment for sore nipples.   Baby started to cue during consult, offered latch assistance and baby was able to latch right away STS, but the latch was shallow mom had her on cross cradle hold, advised to mom to try cross cradle and she did briefly (for 30-40 seconds) before putting her back on cradle hold again. LC reposition the latch, baby was able to get in deep this time but no swallows were heard; it was a brief 5 minutes feeding. Discussed some BF basics and tips for a good latch.  Mom requested a hand pump to take come. Pump instructions, cleaning and storage was reviewed; as well as milk storage guidelines. Encouraged mom to feed baby 8-12 times/24 hours or sooner if feeding cues are present. Reviewed BF brochure, BF resources and feeding diary, mom is aware of [facility] services and will call PRN.  Maternal Data Formula Feeding for Exclusion: Yes Reason for exclusion: Mother's choice to formula and breast feed on admission Has patient been taught Hand Expression?: Yes Does the patient have breastfeeding experience prior to this delivery?: Yes  Feeding Feeding Type: Breast Fed Nipple Type: Slow - flow Length of feed: 5 min  LATCH Score Latch: Grasps breast easily, tongue down, lips flanged, rhythmical sucking.  Audible Swallowing: None  Type of Nipple: Everted at rest and after stimulation  Comfort  (Breast/Nipple): Soft / non-tender  Hold (Positioning): Assistance needed to correctly position infant at breast and maintain latch.  LATCH Score: 7  Interventions Interventions: Breast feeding basics reviewed;Assisted with latch;Skin to skin;Breast massage;Breast compression;Adjust position;Support pillows;Coconut oil;Hand pump  Lactation Tools Discussed/Used Tools: Pump Breast pump type: Manual WIC Program: Yes Pump Review: Setup, frequency, and cleaning;Milk Storage Initiated by:: MPeck Date initiated:: 09/30/17   Consult Status Consult Status: Follow-up Date: 10/01/17 Follow-up type: In-patient    [name] 09/30/2017, 7:19 PM

## 2017-09-30 NOTE — Transfer of Care (Signed)
Immediate Anesthesia Transfer of Care Note  Patient: Kristy Johnson  Procedure(s) Performed: POST PARTUM TUBAL LIGATION (Bilateral )  Patient Location: PACU  Anesthesia Type:Epidural  Level of Consciousness: awake and alert   Airway & Oxygen Therapy: Patient Spontanous Breathing  Post-op Assessment: Report given to RN and Post -op Vital signs reviewed and stable  Post vital signs: Reviewed  Last Vitals:  Vitals Value Taken Time  BP    Temp    Pulse    Resp    SpO2      Last Pain:  Vitals:   09/30/17 0625  TempSrc: Oral  PainSc: 0-No pain      Patients Stated Pain Goal: 0 (54/36/06 7703)  Complications: No apparent anesthesia complications

## 2017-09-30 NOTE — Anesthesia Postprocedure Evaluation (Signed)
Anesthesia Post Note  Patient: Kristy Johnson  Procedure(s) Performed: POST PARTUM TUBAL LIGATION (Bilateral )     Patient location during evaluation: PACU Anesthesia Type: Epidural Level of consciousness: awake Pain management: satisfactory to patient Vital Signs Assessment: post-procedure vital signs reviewed and stable Respiratory status: spontaneous breathing Cardiovascular status: blood pressure returned to baseline Postop Assessment: no headache and spinal receding Anesthetic complications: no    Last Vitals:  Vitals:   09/30/17 0959 09/30/17 1055  BP: 105/75 103/78  Pulse: 69 73  Resp: 18 18  Temp: 36.6 C 36.9 C  SpO2:      Last Pain:  Vitals:   09/30/17 1310  TempSrc:   PainSc: Asleep   Pain Goal: Patients Stated Pain Goal: 0 (09/29/17 0619)               Lyndle Herrlich EDWARD

## 2017-09-30 NOTE — Anesthesia Preprocedure Evaluation (Signed)
Anesthesia Evaluation  Patient identified by MRN, date of birth, ID band Patient awake    Reviewed: Allergy & Precautions, NPO status , Patient's Chart, lab work & pertinent test results  Airway Mallampati: II  TM Distance: >3 FB Neck ROM: Full    Dental no notable dental hx.    Pulmonary neg pulmonary ROS,    Pulmonary exam normal breath sounds clear to auscultation       Cardiovascular negative cardio ROS Normal cardiovascular exam Rhythm:Regular Rate:Normal     Neuro/Psych  Headaches, negative psych ROS   GI/Hepatic negative GI ROS, Neg liver ROS,   Endo/Other  Morbid obesity  Renal/GU negative Renal ROS     Musculoskeletal negative musculoskeletal ROS (+)   Abdominal   Peds  Hematology negative hematology ROS (+) anemia ,   Anesthesia Other Findings   Reproductive/Obstetrics (+) Pregnancy                             Anesthesia Physical  Anesthesia Plan  ASA: III  Anesthesia Plan: Epidural   Post-op Pain Management:    Induction:   PONV Risk Score and Plan:   Airway Management Planned:   Additional Equipment:   Intra-op Plan:   Post-operative Plan:   Informed Consent: I have reviewed the patients History and Physical, chart, labs and discussed the procedure including the risks, benefits and alternatives for the proposed anesthesia with the patient or authorized representative who has indicated his/her understanding and acceptance.     Plan Discussed with:   Anesthesia Plan Comments:         Anesthesia Quick Evaluation

## 2017-09-30 NOTE — Progress Notes (Signed)
IUPC fell out during an episode of vomiting

## 2017-09-30 NOTE — Addendum Note (Signed)
Addendum  created 09/30/17 1444 by Asher Muir, CRNA   Sign clinical note

## 2017-09-30 NOTE — Progress Notes (Signed)
Reviewed PPTL, risks and failure rate>>pt consents, no other change in status

## 2017-09-30 NOTE — Anesthesia Postprocedure Evaluation (Signed)
Anesthesia Post Note  Patient: Kristy Johnson  Procedure(s) Performed: POST PARTUM TUBAL LIGATION (Bilateral )     Patient location during evaluation: Mother Baby Anesthesia Type: Epidural Level of consciousness: awake Pain management: pain level controlled Vital Signs Assessment: post-procedure vital signs reviewed and stable Respiratory status: spontaneous breathing Cardiovascular status: blood pressure returned to baseline Postop Assessment: patient able to bend at knees, no headache and no backache    Last Vitals:  Vitals:   09/30/17 1055 09/30/17 1436  BP: 103/78 112/80  Pulse: 73 73  Resp: 18 18  Temp: 36.9 C 36.8 C  SpO2:      Last Pain:  Vitals:   09/30/17 1436  TempSrc: Oral  PainSc: 6    Pain Goal: Patients Stated Pain Goal: 0 (09/29/17 7001)               Casimer Lanius

## 2017-09-30 NOTE — Anesthesia Postprocedure Evaluation (Signed)
Anesthesia Post Note  Patient: Kristy Johnson  Procedure(s) Performed: AN AD HOC LABOR EPIDURAL     Patient location during evaluation: Mother Baby Anesthesia Type: Epidural Level of consciousness: awake Pain management: satisfactory to patient Vital Signs Assessment: post-procedure vital signs reviewed and stable Respiratory status: spontaneous breathing Cardiovascular status: stable Anesthetic complications: no    Last Vitals:  Vitals:   09/30/17 1055 09/30/17 1436  BP: 103/78 112/80  Pulse: 73 73  Resp: 18 18  Temp: 36.9 C 36.8 C  SpO2:      Last Pain:  Vitals:   09/30/17 1436  TempSrc: Oral  PainSc: 6    Pain Goal: Patients Stated Pain Goal: 0 (09/29/17 3338)               Casimer Lanius

## 2017-09-30 NOTE — Op Note (Signed)
Preoperative diagnosis: Request permanent sterilization  Postoperative diagnosis: Same  Procedure: Postpartum tubal ligation with Filshie clips  Surgeon: Matthew Saras  Anesthesia: Epidural  Procedure and findings:  The patient was taken to the operating room after an adequate level of epidural anesthesia was obtained with the patient's upon the abdomen prepped and draped, Foley catheter was position appropriate timeout was taken at that point.  The subumbilical area was infiltrated with quarter percent Marcaine plain a small incision was made carried down individually through subcutaneous tissue fascia and peritoneum, [employer] retractors were then positioned in the right tube and ovary can be seen readily, was traced to the fimbriated end, regrasped that midsegment and a Filshie clip applied and a right angle at the proximal tube completely engulfing the tube with excellent application.  On the left, due to fibroids, required more exposure to finally isolate the left tube which again was traced to the fimbriated end for positive identification, regressed at mid segment and the Filshie clip applied to the right angle to the proximal tube, excellent application.  Prior to closure sponge, needle, instrument counts reported as correct x2.  Fascia closed with a running 2-0 Vicryl suture interrupted 2-0 Vicryl sutures for the subcutaneous tissue and a 4-0 Monocryl subcuticular skin closure she tolerated this well went to recovery room in good condition.  Dictated with Dragon medical 1  Margarette Asal MD

## 2017-10-01 ENCOUNTER — Encounter (HOSPITAL_COMMUNITY): Payer: Self-pay | Admitting: Obstetrics and Gynecology

## 2017-10-01 LAB — CBC
HCT: 31.1 % — ABNORMAL LOW (ref 36.0–46.0)
HEMOGLOBIN: 10.4 g/dL — AB (ref 12.0–15.0)
MCH: 29.1 pg (ref 26.0–34.0)
MCHC: 33.4 g/dL (ref 30.0–36.0)
MCV: 87.1 fL (ref 78.0–100.0)
Platelets: 160 10*3/uL (ref 150–400)
RBC: 3.57 MIL/uL — ABNORMAL LOW (ref 3.87–5.11)
RDW: 17.3 % — ABNORMAL HIGH (ref 11.5–15.5)
WBC: 9.3 10*3/uL (ref 4.0–10.5)

## 2017-10-01 MED ORDER — IBUPROFEN 800 MG PO TABS: 800.0000 mg | ORAL_TABLET | Freq: Three times a day (TID) | ORAL | 0 refills | Status: DC | PRN

## 2017-10-01 MED ORDER — OXYCODONE-ACETAMINOPHEN 5-325 MG PO TABS: 1.0000 | ORAL_TABLET | ORAL | 0 refills | Status: DC | PRN

## 2017-10-01 NOTE — Discharge Summary (Signed)
Obstetric Discharge Summary Reason for Admission: induction of labor Prenatal Procedures: none Intrapartum Procedures: spontaneous vaginal delivery Postpartum Procedures: P.P. tubal ligation Complications-Operative and Postpartum: none Hemoglobin  Date Value Ref Range Status  10/01/2017 10.4 (L) 12.0 - 15.0 g/dL Final   HCT  Date Value Ref Range Status  10/01/2017 31.1 (L) 36.0 - 46.0 % Final    Physical Exam:  General: alert Lochia: appropriate Uterine Fundus: firm Incision: healing well DVT Evaluation: No evidence of DVT seen on physical exam.  Discharge Diagnoses: Term Pregnancy-delivered  Discharge Information: Date: 10/01/2017 Activity: pelvic rest Diet: routine Medications: PNV, Ibuprofen and Percocet Condition: stable Instructions: refer to practice specific booklet Discharge to: home Follow-up Information    Molli Posey, MD Follow up in 2 week(s).   Specialty:  Obstetrics and Gynecology Why:  office will call for 2 wk visit Contact information: [address] [place] [address] [phone]           Newborn Data: Live born female  Birth Weight: 5 lb 8.5 oz (2509 g) APGAR: 45, 9  Newborn Delivery   Birth date/time:  09/30/2017 03:19:00 Delivery type:  Vaginal, Spontaneous     Home with mother.  [place] 10/01/2017, 8:37 AM

## 2017-10-02 ENCOUNTER — Ambulatory Visit: Payer: Self-pay

## 2017-10-02 DIAGNOSIS — Z832 Family history of diseases of the blood and blood-forming organs and certain disorders involving the immune mechanism: Secondary | ICD-10-CM | POA: Diagnosis not present

## 2017-10-02 DIAGNOSIS — Z8489 Family history of other specified conditions: Secondary | ICD-10-CM | POA: Diagnosis not present

## 2017-10-02 NOTE — Lactation Note (Signed)
This note was copied from a baby's chart. Lactation Consultation Note  Patient Name: Kristy Johnson EUMPN'T Date: 10/02/2017 Reason for consult: Follow-up assessment   P3, Baby 29 hours old.  < 6 lbs. FOB giving baby bottle of formula upon entering. Mother states baby breastfed for 30 min prior to formula feeding. Mom encouraged to feed baby 8-12 times/24 hours and with feeding cues at least q 3 hours. Family denies concerns or questions. Mother has manual breast pump. Reviewed engorgement care and monitoring voids/stools.      Maternal Data    Feeding Feeding Type: Breast Fed Length of feed: 30 min  LATCH Score Latch: Grasps breast easily, tongue down, lips flanged, rhythmical sucking.  Audible Swallowing: A few with stimulation  Type of Nipple: Everted at rest and after stimulation  Comfort (Breast/Nipple): Soft / non-tender  Hold (Positioning): No assistance needed to correctly position infant at breast.  LATCH Score: 9  Interventions    Lactation Tools Discussed/Used     Consult Status Consult Status: Complete    Carlye Grippe 10/02/2017, 10:03 AM

## 2017-10-04 DIAGNOSIS — Z00110 Health examination for newborn under 8 days old: Secondary | ICD-10-CM | POA: Diagnosis not present

## 2017-10-13 ENCOUNTER — Ambulatory Visit: Payer: BLUE CROSS/BLUE SHIELD | Admitting: Family Medicine

## 2017-10-13 ENCOUNTER — Encounter: Payer: Self-pay | Admitting: Family Medicine

## 2017-10-13 ENCOUNTER — Other Ambulatory Visit: Payer: Self-pay

## 2017-10-13 VITALS — BP 110/78 | HR 78 | Temp 98.1°F | Ht 64.0 in | Wt 226.0 lb

## 2017-10-13 DIAGNOSIS — Z0000 Encounter for general adult medical examination without abnormal findings: Secondary | ICD-10-CM | POA: Diagnosis not present

## 2017-10-13 DIAGNOSIS — D500 Iron deficiency anemia secondary to blood loss (chronic): Secondary | ICD-10-CM | POA: Diagnosis not present

## 2017-10-13 DIAGNOSIS — Z0001 Encounter for general adult medical examination with abnormal findings: Secondary | ICD-10-CM

## 2017-10-13 NOTE — Patient Instructions (Signed)
It was great to meet you today! Thank you for letting me participate in your care!  Today, we discussed your medical history and that you have a history of iron deficiency anemia. I will recheck your blood counts, iron level, and associated tests to ensure you have adequate amounts of iron.   I am also checking your cholesterol today to ensure you don't have high cholesterol since it has not been checked in the recent past.  Please call me if you need anything or have any questions. Our office will inform you of the results if normal. If abnormal I will call you and let you know what next steps to take.  Be well, Harolyn Rutherford, DO PGY-1, [facility]

## 2017-10-13 NOTE — Progress Notes (Signed)
     Subjective: Chief Complaint  Patient presents with  . New Patient (Initial Visit)    no concerns     HPI: Kristy Johnson is a 42 y.o. presenting to clinic today to discuss the following:  Establish Care Patient states she does not have a PCP. She recently gave birth and is looking to establish care.  Hx of Iron Deficiency Anemia Patient states she has had this "since she was young". She was taking ferrous sulfate during pregnancy and states she required two iron infusions during pregnancy. Her last CBC was on 4/21 with H/H of 10.4/31.1 from [facility] records.  She has no complaints today and denies fever, chills, fatigue, chest pain, palpitations, SOB, difficulty breathing, abdominal pain, nausea, vomiting, diarrhea, constipation, burning on urination. No ice cravings.  She does endorse a history of chronic heavy menstrual cycles.  Health Maintenance: None today     ROS noted in HPI.   Past Medical, Surgical, Social, and Family History Reviewed & Updated per EMR.   Pertinent Historical Findings include:   Social History   Tobacco Use  Smoking Status Never Smoker  Smokeless Tobacco Never Used   Objective: Ht 5\' 4"  (1.626 m)   Wt 226 lb (102.5 kg)   BMI 38.79 kg/m  Vitals and nursing notes reviewed  Physical Exam  Constitutional: She is oriented to person, place, and time. She appears well-developed and well-nourished. No distress.  HENT:  Head: Normocephalic and atraumatic.  Right Ear: External ear normal.  Left Ear: External ear normal.  Eyes: Pupils are equal, round, and reactive to light. Conjunctivae and EOM are normal.  Neck: Normal range of motion. Neck supple. No thyromegaly present.  Cardiovascular: Normal rate, regular rhythm, normal heart sounds and intact distal pulses.  No murmur heard. Pulmonary/Chest: Effort normal and breath sounds normal. No respiratory distress. She has no wheezes. She has no rales.  Abdominal: Soft. Bowel sounds are normal.  She exhibits no distension. There is no tenderness. There is no guarding.  Musculoskeletal: Normal range of motion. She exhibits edema. She exhibits no tenderness or deformity.  Trace pedal edema bilaterally  Lymphadenopathy:    She has no cervical adenopathy.  Neurological: She is alert and oriented to person, place, and time.  Skin: Skin is warm and dry. Capillary refill takes less than 2 seconds. No rash noted. No erythema.  Psychiatric: She has a normal mood and affect. Her behavior is normal.   No results found for this or any previous visit (from the past 72 hour(s)).  Assessment/Plan:  Healthcare maintenance Lipid panel since it has not been checked and no record in epic.  Iron deficiency anemia due to chronic blood loss Patient states she has heavy periods. Last H/H was during pregnancy and was normal for pregnancy.  Obtaining CBC, Iron, TIBC, and Ferritin to ensure she is not currently suffering from a microcytic anemia due iron deficiency.  PATIENT EDUCATION PROVIDED: See AVS    Diagnosis and plan along with any newly prescribed medication(s) were discussed in detail with this patient today. The patient verbalized understanding and agreed with the plan. Patient advised if symptoms worsen return to clinic or ER.   Health Maintainance:   Orders Placed This Encounter  Procedures  . CBC With Differential  . Iron, TIBC and Ferritin Panel  . Lipid Panel    No orders of the defined types were placed in this encounter.   Harolyn Rutherford, DO 10/13/2017, 11:32 AM PGY-1, [place]

## 2017-10-14 LAB — CBC WITH DIFFERENTIAL
BASOS: 1 %
Basophils Absolute: 0 10*3/uL (ref 0.0–0.2)
EOS (ABSOLUTE): 0.2 10*3/uL (ref 0.0–0.4)
EOS: 4 %
HEMATOCRIT: 40.5 % (ref 34.0–46.6)
HEMOGLOBIN: 13.2 g/dL (ref 11.1–15.9)
IMMATURE GRANS (ABS): 0 10*3/uL (ref 0.0–0.1)
Immature Granulocytes: 0 %
LYMPHS: 33 %
Lymphocytes Absolute: 1.7 10*3/uL (ref 0.7–3.1)
MCH: 28.1 pg (ref 26.6–33.0)
MCHC: 32.6 g/dL (ref 31.5–35.7)
MCV: 86 fL (ref 79–97)
MONOCYTES: 7 %
Monocytes Absolute: 0.4 10*3/uL (ref 0.1–0.9)
NEUTROS ABS: 2.9 10*3/uL (ref 1.4–7.0)
NEUTROS PCT: 55 %
RBC: 4.69 x10E6/uL (ref 3.77–5.28)
RDW: 16.6 % — ABNORMAL HIGH (ref 12.3–15.4)
WBC: 5.2 10*3/uL (ref 3.4–10.8)

## 2017-10-14 LAB — IRON,TIBC AND FERRITIN PANEL
FERRITIN: 84 ng/mL (ref 15–150)
IRON SATURATION: 23 % (ref 15–55)
IRON: 72 ug/dL (ref 27–159)
Total Iron Binding Capacity: 320 ug/dL (ref 250–450)
UIBC: 248 ug/dL (ref 131–425)

## 2017-10-14 LAB — LIPID PANEL
CHOL/HDL RATIO: 2.2 ratio (ref 0.0–4.4)
Cholesterol, Total: 191 mg/dL (ref 100–199)
HDL: 87 mg/dL (ref >39)
LDL Calculated: 94 mg/dL (ref 0–99)
TRIGLYCERIDES: 51 mg/dL (ref 0–149)
VLDL Cholesterol Cal: 10 mg/dL (ref 5–40)

## 2017-10-17 DIAGNOSIS — Z0000 Encounter for general adult medical examination without abnormal findings: Secondary | ICD-10-CM | POA: Insufficient documentation

## 2017-10-17 DIAGNOSIS — Z1159 Encounter for screening for other viral diseases: Secondary | ICD-10-CM | POA: Insufficient documentation

## 2017-10-17 DIAGNOSIS — D500 Iron deficiency anemia secondary to blood loss (chronic): Secondary | ICD-10-CM | POA: Insufficient documentation

## 2017-10-17 NOTE — Assessment & Plan Note (Signed)
Lipid panel since it has not been checked and no record in epic.

## 2017-10-17 NOTE — Assessment & Plan Note (Signed)
Patient states she has heavy periods. Last H/H was during pregnancy and was normal for pregnancy.  Obtaining CBC, Iron, TIBC, and Ferritin to ensure she is not currently suffering from a microcytic anemia due iron deficiency.

## 2017-11-01 DIAGNOSIS — D509 Iron deficiency anemia, unspecified: Secondary | ICD-10-CM | POA: Diagnosis not present

## 2017-11-21 DIAGNOSIS — D259 Leiomyoma of uterus, unspecified: Secondary | ICD-10-CM | POA: Diagnosis not present

## 2018-11-12 IMAGING — MR MR ABDOMEN W/O CM
5 of 10 series · 18 of 48 positions shown · non-contrast
Comparison: None applicable

CLINICAL DATA: Abdominal pain, vomiting. Flu-like symptoms. Pt
states rt sided abd pain, pt 28 weeks pregnant Hx of fibroids. 3
tries to get pt into scanner for exam. Pt nervous, claustrophobic
and uncomfortable inside scanner. Best possible images.

EXAM:
MRI ABDOMEN AND PELVIS WITHOUT CONTRAST
TECHNIQUE: Multiplanar multisequence MR imaging of the abdomen and pelvis was
performed. No intravenous contrast was administered.

[Series 4: bSSFP · axial · 6.0mm · 0.94mm/px · z∈[-173,+275]mm · 5 of 65 slices shown (1 of 2)]
[im 1/65]
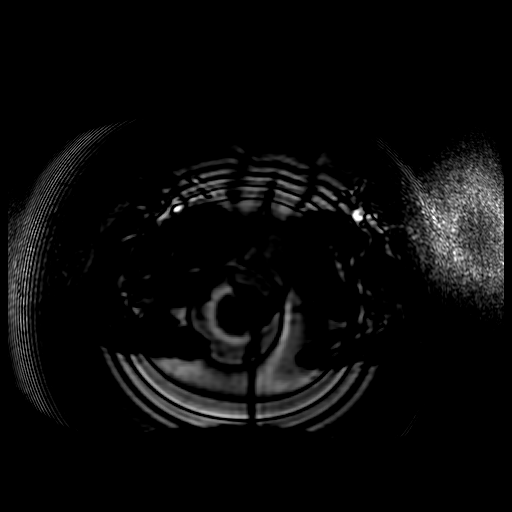
[im 17/65]
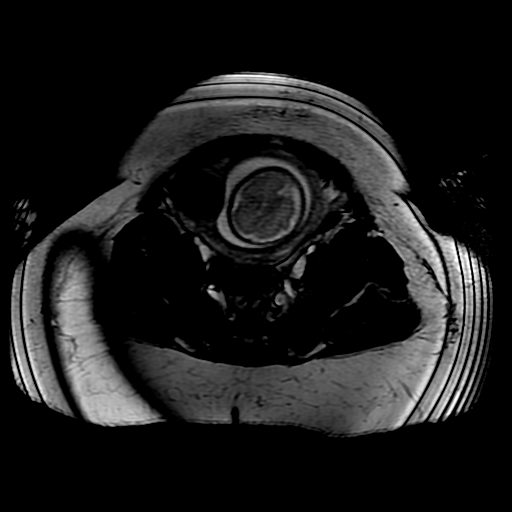
[im 33/65]
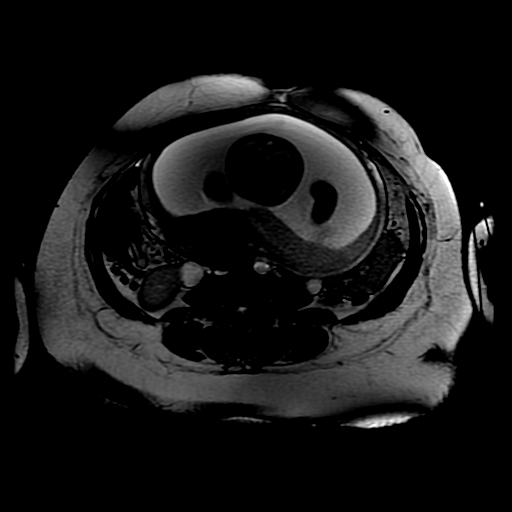
[im 49/65]
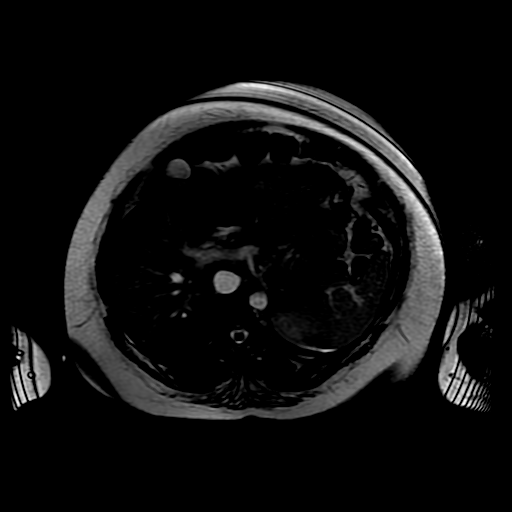
[im 65/65]
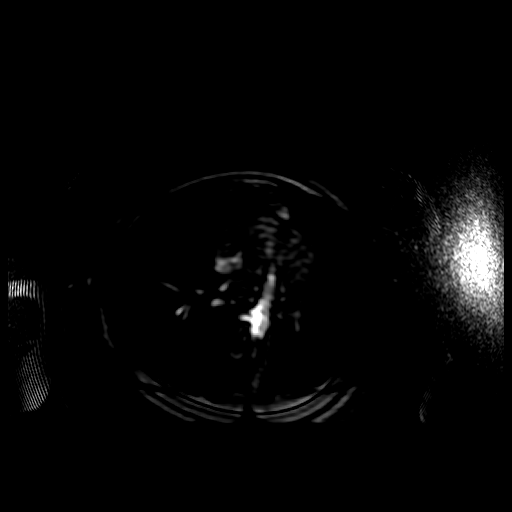

[Series 5: T2 · coronal · 10.0mm · 0.94mm/px · 2 of 30 slices shown]
[im 1/30]
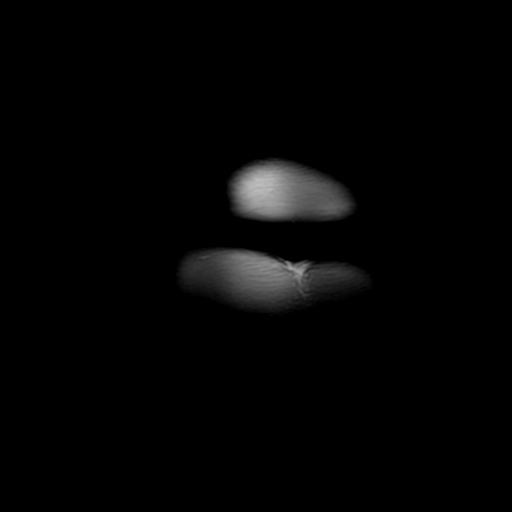
[im 30/30]
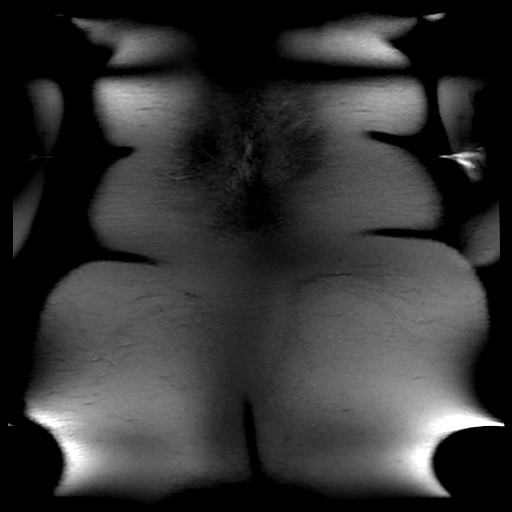

[Series 6: T2 fat-sat · axial · 5.0mm · 0.70mm/px · z∈[-165,+279]mm · 6 of 75 slices shown]
[im 1/75]
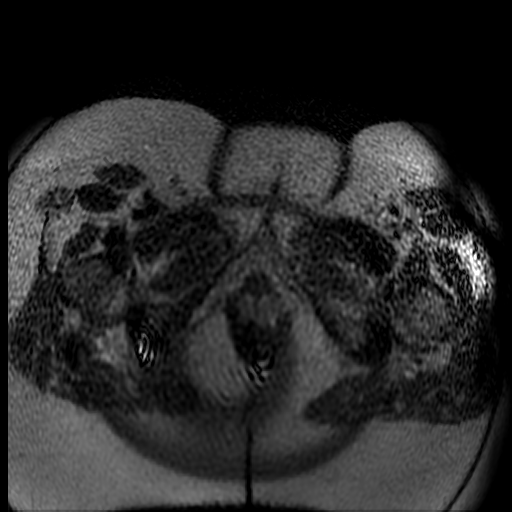
[im 15/75]
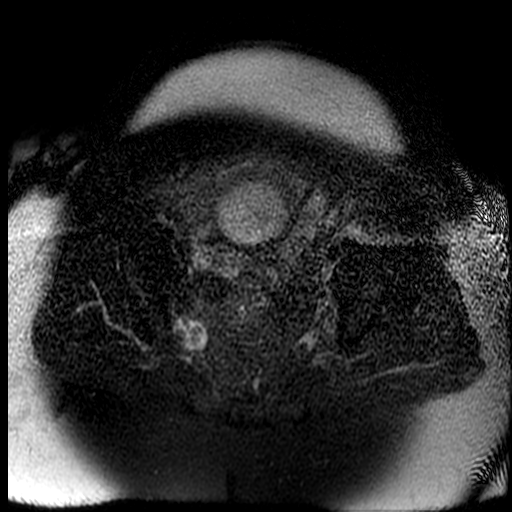
[im 30/75]
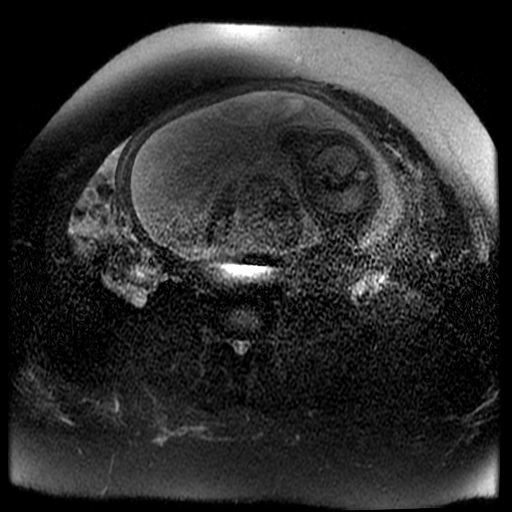
[im 45/75]
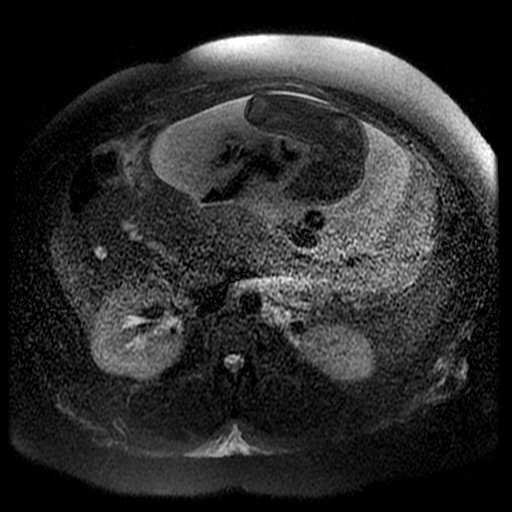
[im 60/75]
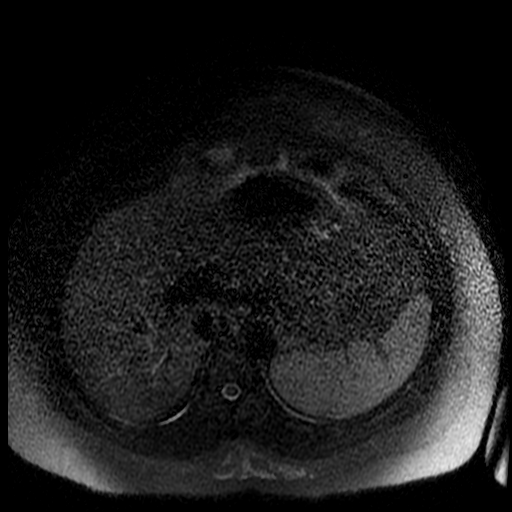
[im 75/75]
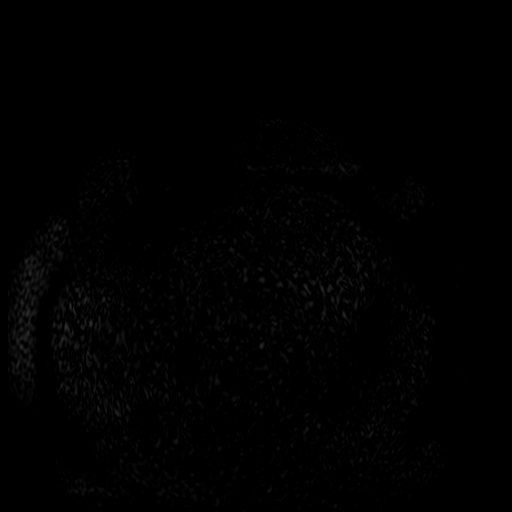

[Series 7: bSSFP · coronal · 10.0mm · 0.94mm/px · 2 of 30 slices shown (2 of 2)]
[im 1/30]
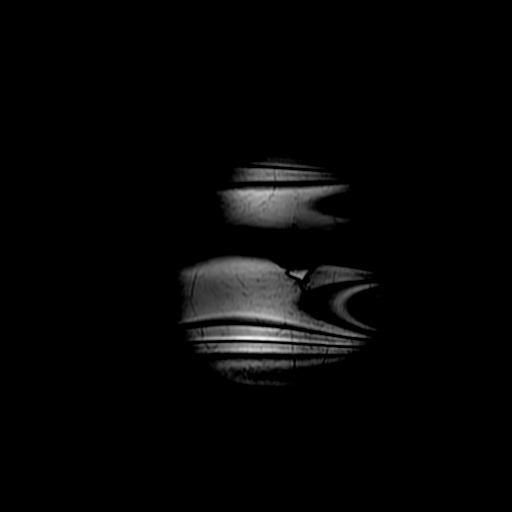
[im 30/30]
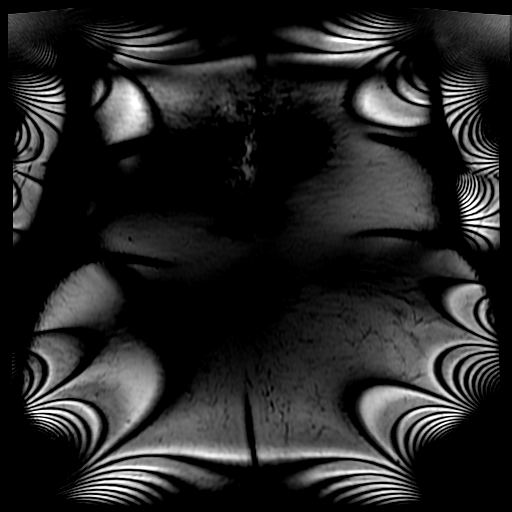

[Series 8: T1 dynamic · axial · 5.0mm · 0.72mm/px · z∈[-10,+58]mm · 3 of 112 slices shown]
[im 1/112]
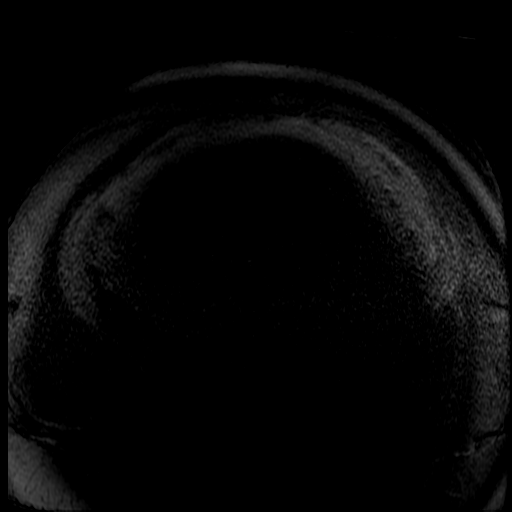
[im 14/112]
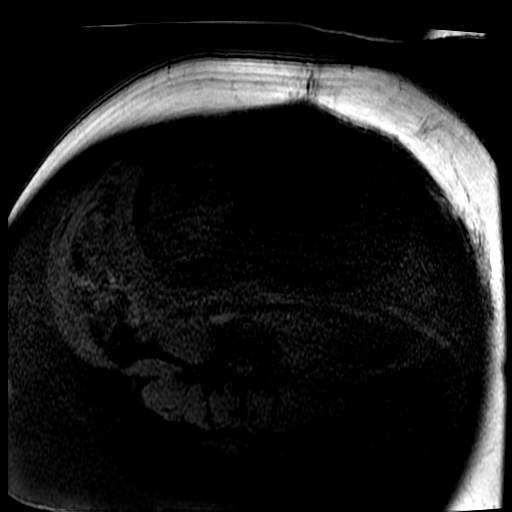
[im 28/112]
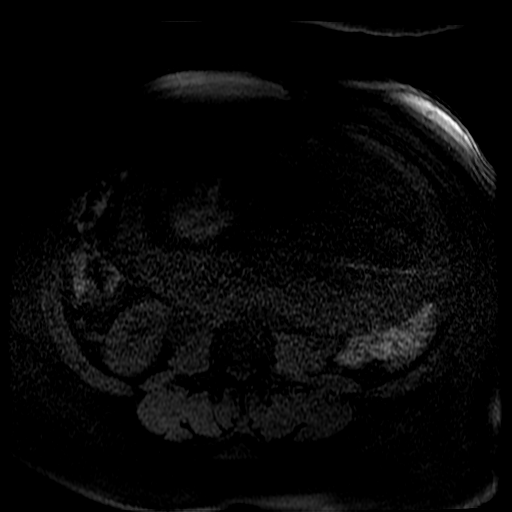

[18 of 48 positions shown; findings below may reference images not displayed]

FINDINGS: COMBINED FINDINGS FOR BOTH MR ABDOMEN AND PELVIS

Lower chest: No acute findings.

Hepatobiliary: No mass or other parenchymal abnormality identified.

Pancreas: No mass, inflammatory changes, or other parenchymal
abnormality identified.

Spleen:  Within normal limits in size and appearance.

Adrenals/Urinary Tract: No masses identified. No evidence of
hydronephrosis.

Stomach/Bowel: Stomach is normal in appearance. No small bowel
dilatation. The appendix is well seen and is normal in appearance.
No periappendiceal fluid. Loops of colon are grossly unremarkable.

Vascular/Lymphatic: No pathologically enlarged lymph nodes
identified. No abdominal aortic aneurysm demonstrated.

Reproductive: Gravid uterus, fetus in cephalic presentation.
Multiple uterine fibroids are present. Right lower uterine segment
fibroid measures 4.3 x 4.9 centimeters. Large pedunculated posterior
lower uterine segment fibroid is 7.4 x 4.6 centimeters. Additional
posterior myometrial fibroids are present. The placenta is fundal
and posterior. No previa. Ovaries are unremarkable in appearance.

Other:  None

Musculoskeletal: No suspicious bone lesions identified.
IMPRESSION: 1. Normal appearance of the appendix.
2. Multiple uterine fibroids, as described. Large pedunculated lower
uterine segment fibroid is 7.4 centimeters. Intramural right lower
uterine segment fibroid is 4.9 centimeters.
3. Gravid uterus, with fetus in cephalic presentation.
4. No placenta previa.

## 2018-11-14 IMAGING — CR DG CHEST 2V
2 series · 2 of 2 positions shown · non-contrast
Comparison: 07/15/2017

CLINICAL DATA: Shortness of breath.  Twenty-eight weeks pregnant.

EXAM:
CHEST  2 VIEW

[chest pa]
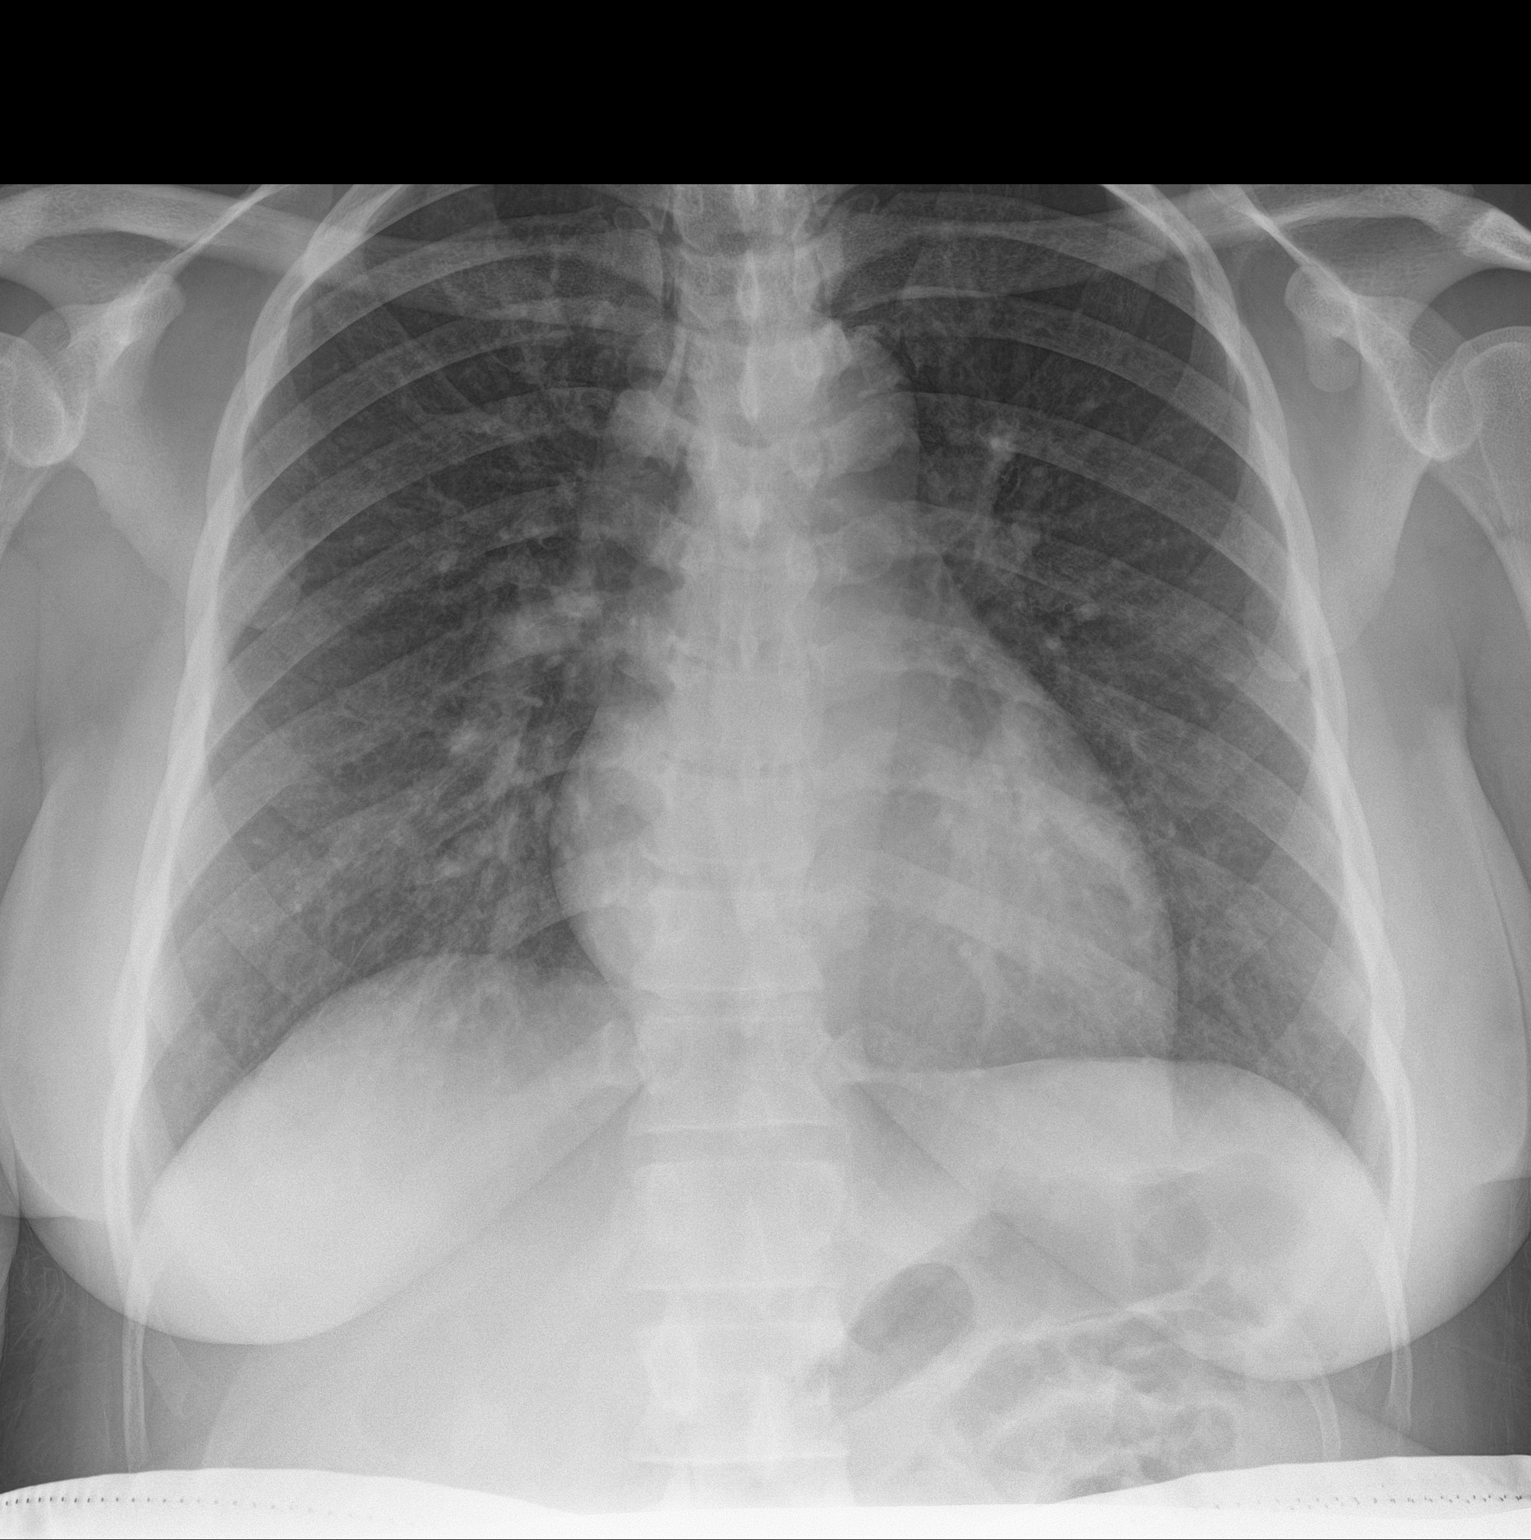

[chest lat]
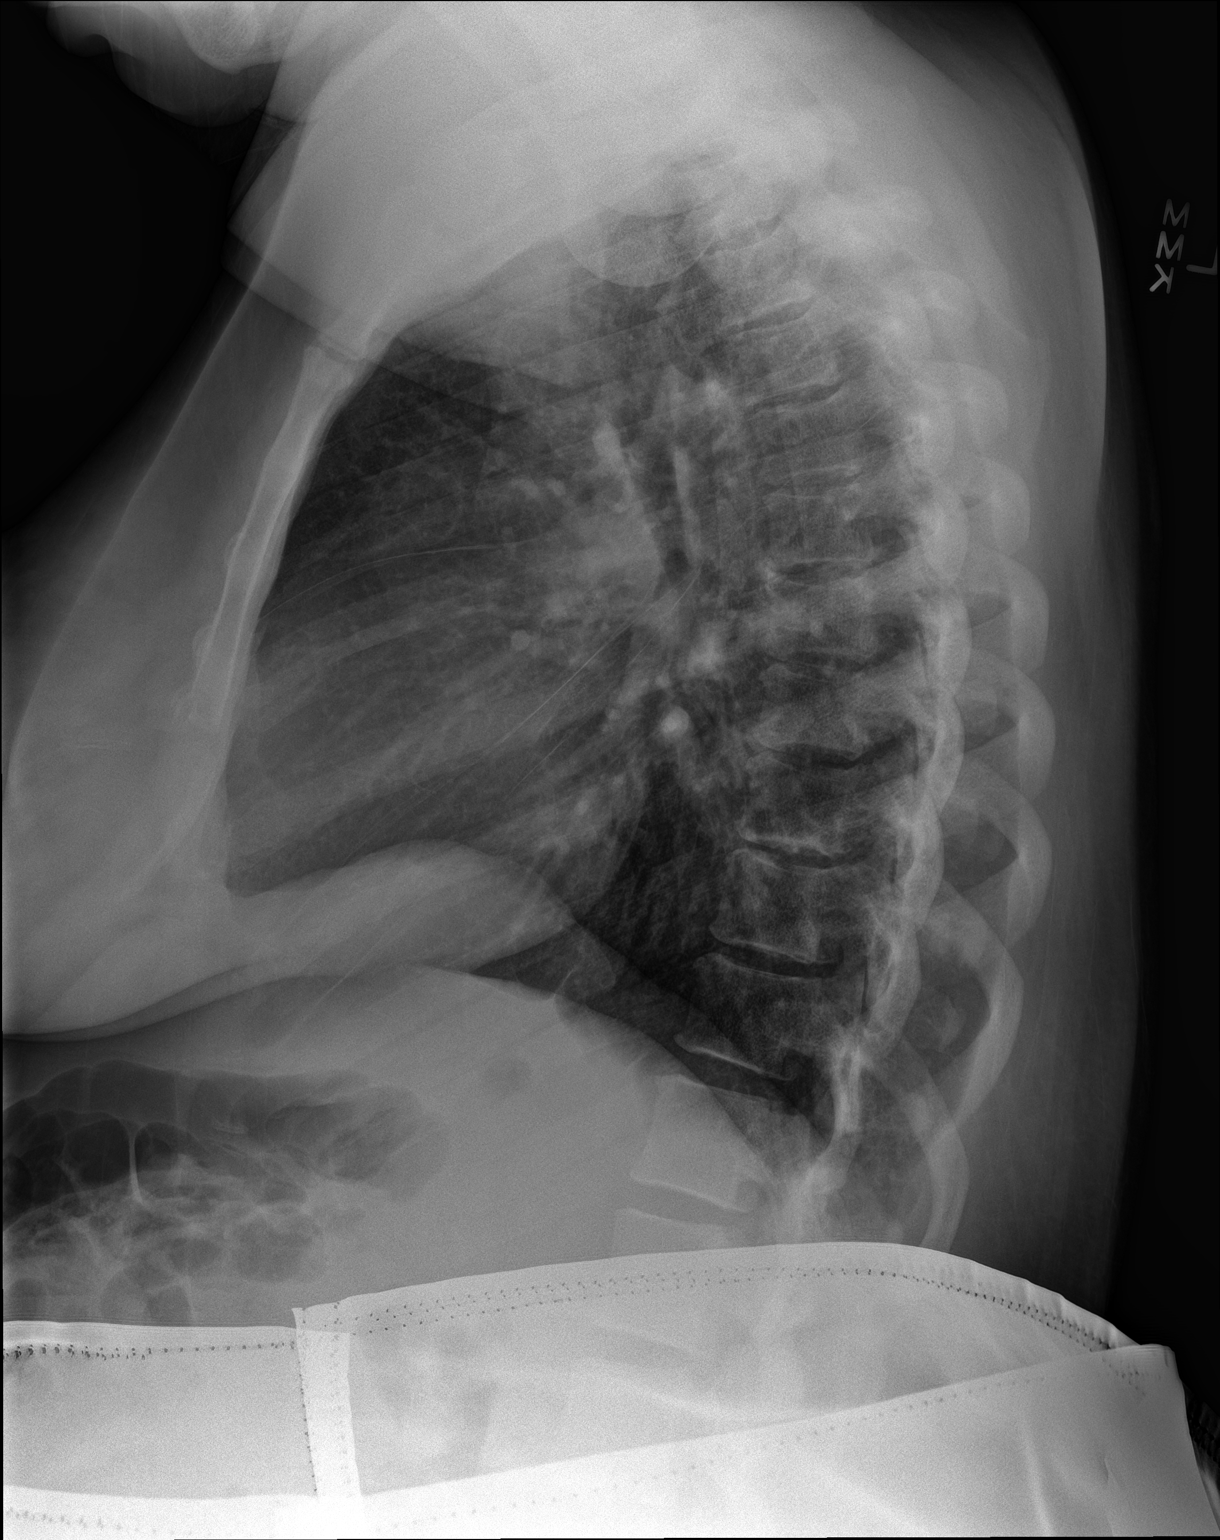

[2 of 2 positions shown; findings below may reference images not displayed]

FINDINGS: The heart size and mediastinal contours are within normal limits.
Both lungs are clear. The visualized skeletal structures are
unremarkable.
IMPRESSION: No active cardiopulmonary disease.

## 2019-01-31 DIAGNOSIS — Z03818 Encounter for observation for suspected exposure to other biological agents ruled out: Secondary | ICD-10-CM | POA: Diagnosis not present

## 2019-03-22 DIAGNOSIS — Z23 Encounter for immunization: Secondary | ICD-10-CM | POA: Diagnosis not present

## 2019-04-10 ENCOUNTER — Other Ambulatory Visit: Payer: Self-pay | Admitting: Family Medicine

## 2019-04-10 ENCOUNTER — Ambulatory Visit
Admission: RE | Admit: 2019-04-10 | Discharge: 2019-04-10 | Disposition: A | Discharge status: Home / Self Care | Payer: No Typology Code available for payment source | Source: Ambulatory Visit | Attending: Family Medicine | Admitting: Family Medicine

## 2019-04-10 DIAGNOSIS — R52 Pain, unspecified: Secondary | ICD-10-CM

## 2019-05-14 DIAGNOSIS — Z03818 Encounter for observation for suspected exposure to other biological agents ruled out: Secondary | ICD-10-CM | POA: Diagnosis not present

## 2019-05-22 DIAGNOSIS — Z20828 Contact with and (suspected) exposure to other viral communicable diseases: Secondary | ICD-10-CM | POA: Diagnosis not present

## 2019-05-22 DIAGNOSIS — Z1159 Encounter for screening for other viral diseases: Secondary | ICD-10-CM | POA: Diagnosis not present

## 2019-05-27 DIAGNOSIS — Z03818 Encounter for observation for suspected exposure to other biological agents ruled out: Secondary | ICD-10-CM | POA: Diagnosis not present

## 2019-06-11 DIAGNOSIS — Z03818 Encounter for observation for suspected exposure to other biological agents ruled out: Secondary | ICD-10-CM | POA: Diagnosis not present

## 2019-06-17 DIAGNOSIS — H524 Presbyopia: Secondary | ICD-10-CM | POA: Diagnosis not present

## 2019-06-17 DIAGNOSIS — H5213 Myopia, bilateral: Secondary | ICD-10-CM | POA: Diagnosis not present

## 2019-06-17 DIAGNOSIS — H52203 Unspecified astigmatism, bilateral: Secondary | ICD-10-CM | POA: Diagnosis not present

## 2019-07-08 DIAGNOSIS — Z03818 Encounter for observation for suspected exposure to other biological agents ruled out: Secondary | ICD-10-CM | POA: Diagnosis not present

## 2019-07-15 DIAGNOSIS — Z03818 Encounter for observation for suspected exposure to other biological agents ruled out: Secondary | ICD-10-CM | POA: Diagnosis not present

## 2019-12-25 ENCOUNTER — Ambulatory Visit (INDEPENDENT_AMBULATORY_CARE_PROVIDER_SITE_OTHER): Payer: Self-pay | Admitting: Family Medicine

## 2019-12-25 ENCOUNTER — Other Ambulatory Visit: Payer: Self-pay

## 2019-12-25 VITALS — BP 112/72 | HR 80

## 2019-12-25 DIAGNOSIS — J069 Acute upper respiratory infection, unspecified: Secondary | ICD-10-CM

## 2019-12-25 NOTE — Progress Notes (Signed)
    SUBJECTIVE:   CHIEF COMPLAINT / HPI:   Cough: one week of dry cough.  Feels like something is in her throat and she starts coughing.  Wakes up coughing sometimes.  Has been taking mucinex and cough drops.  This does not help.  One or two episodes of having trouble swallowing.  No heartburn, no reflux.  No stomach pain.  No diarrhea.   Has seasonal allergies. She takes allergy medicine from march to may.  Never diagnosed with asthma and does not use inhalers.  One or twice a year she will have a day of wheezing. Started feeling better today,.   Her 25-year-old daughter has been coughing with runny nose recently.patient has both covid vaccinations and has never had covid.     PERTINENT  PMH / PSH: none  OBJECTIVE:   BP 112/72   Pulse 80   SpO2 99%   Gen: alert. No acute distress.   HEENT: Nasal congestion.  Oropharynx clear.  Moist oral mucosa. CV: Regular rate and rhythm, no murmurs Pulm: coughing.  Lungs clear to auscultation bilaterally   ASSESSMENT/PLAN:   Viral URI with cough Symptoms appear most consistent with viral upper respiratory infection, especially given her daughter has similar symptoms.  Very unlikely to be Covid as patient has had both vaccinations and symptoms appear to be localized in the upper respiratory tract with clear lungs.  Advised patient to continue symptomatic treatment as outlined in her AVS.  Has scheduled the patient a physical with Dr. Posey Pronto a few weeks from now.     Benay Pike, MD [place]

## 2019-12-25 NOTE — Patient Instructions (Signed)
It was nice to meet you today,  Your symptoms appear to be consistent with a viral upper respiratory infection.  This will go away on its own over the next week to 2 weeks.  If you do not experience any change in your cough after 2 weeks you can always reschedule another appointment with [place] to be reevaluated.  Continue to use what ever cough medications you think are helping.  You can use honey to help soothe sore throat.  You can also use Tylenol or ibuprofen for pain in your ribs from coughing.  We have scheduled you a physical with your PCP, Dr. Posey Pronto.  The date and time is listed in this paperwork.  Have a great day,  Clemetine Marker, MD

## 2019-12-28 NOTE — Assessment & Plan Note (Signed)
Symptoms appear most consistent with viral upper respiratory infection, especially given her daughter has similar symptoms.  Very unlikely to be Covid as patient has had both vaccinations and symptoms appear to be localized in the upper respiratory tract with clear lungs.  Advised patient to continue symptomatic treatment as outlined in her AVS.  Has scheduled the patient a physical with Dr. Posey Pronto a few weeks from now.

## 2020-01-06 DIAGNOSIS — Z6841 Body Mass Index (BMI) 40.0 and over, adult: Secondary | ICD-10-CM | POA: Diagnosis not present

## 2020-01-06 DIAGNOSIS — D649 Anemia, unspecified: Secondary | ICD-10-CM | POA: Diagnosis not present

## 2020-01-06 DIAGNOSIS — Z01419 Encounter for gynecological examination (general) (routine) without abnormal findings: Secondary | ICD-10-CM | POA: Diagnosis not present

## 2020-01-06 DIAGNOSIS — N920 Excessive and frequent menstruation with regular cycle: Secondary | ICD-10-CM | POA: Diagnosis not present

## 2020-01-20 ENCOUNTER — Other Ambulatory Visit: Payer: Self-pay | Admitting: Obstetrics and Gynecology

## 2020-01-20 DIAGNOSIS — N924 Excessive bleeding in the premenopausal period: Secondary | ICD-10-CM | POA: Diagnosis not present

## 2020-01-20 DIAGNOSIS — N946 Dysmenorrhea, unspecified: Secondary | ICD-10-CM | POA: Diagnosis not present

## 2020-01-20 DIAGNOSIS — D369 Benign neoplasm, unspecified site: Secondary | ICD-10-CM

## 2020-01-20 DIAGNOSIS — N6310 Unspecified lump in the right breast, unspecified quadrant: Secondary | ICD-10-CM | POA: Diagnosis not present

## 2020-01-20 DIAGNOSIS — D259 Leiomyoma of uterus, unspecified: Secondary | ICD-10-CM | POA: Diagnosis not present

## 2020-01-23 ENCOUNTER — Ambulatory Visit: Payer: No Typology Code available for payment source | Admitting: Family Medicine

## 2020-02-03 ENCOUNTER — Other Ambulatory Visit: Payer: No Typology Code available for payment source

## 2020-02-10 ENCOUNTER — Other Ambulatory Visit: Payer: No Typology Code available for payment source

## 2020-02-12 ENCOUNTER — Encounter: Payer: Self-pay | Admitting: Family Medicine

## 2020-02-12 ENCOUNTER — Other Ambulatory Visit: Payer: Self-pay

## 2020-02-12 ENCOUNTER — Ambulatory Visit (INDEPENDENT_AMBULATORY_CARE_PROVIDER_SITE_OTHER): Payer: No Typology Code available for payment source | Admitting: Family Medicine

## 2020-02-12 VITALS — BP 102/62 | HR 71 | Ht 64.0 in | Wt 253.0 lb

## 2020-02-12 DIAGNOSIS — Z0000 Encounter for general adult medical examination without abnormal findings: Secondary | ICD-10-CM

## 2020-02-12 DIAGNOSIS — M722 Plantar fascial fibromatosis: Secondary | ICD-10-CM

## 2020-02-12 DIAGNOSIS — Z113 Encounter for screening for infections with a predominantly sexual mode of transmission: Secondary | ICD-10-CM | POA: Diagnosis not present

## 2020-02-12 NOTE — Patient Instructions (Addendum)
Buy compression stockings, wear these everyday. Take motrin 400mg  three times a day and tylenol 1g 4 times a day. Please apply ice to the area and rest the feet. Do daily foot and calve stretches. You also buy a night splint/apply tape and see if this helps.  We can consider steroids if the above has not helped in the next 1-3 months.  Best wishes,  Dr Posey Pronto     Plantar Fasciitis  Plantar fasciitis is a painful foot condition that affects the heel. It occurs when the band of tissue that connects the toes to the heel bone (plantar fascia) becomes irritated. This can happen as the result of exercising too much or doing other repetitive activities (overuse injury). The pain from plantar fasciitis can range from mild irritation to severe pain that makes it difficult to walk or move. The pain is usually worse in the morning after sleeping, or after sitting or lying down for a while. Pain may also be worse after long periods of walking or standing. What are the causes? This condition may be caused by:  Standing for long periods of time.  Wearing shoes that do not have good arch support.  Doing activities that put stress on joints (high-impact activities), including running, aerobics, and ballet.  Being overweight.  An abnormal way of walking (gait).  Tight muscles in the back of your lower leg (calf).  High arches in your feet.  Starting a new athletic activity. What are the signs or symptoms? The main symptom of this condition is heel pain. Pain may:  Be worse with first steps after a time of rest, especially in the morning after sleeping or after you have been sitting or lying down for a while.  Be worse after long periods of standing still.  Decrease after 30-45 minutes of activity, such as gentle walking. How is this diagnosed? This condition may be diagnosed based on your medical history and your symptoms. Your health care provider may ask questions about your activity  level. Your health care provider will do a physical exam to check for:  A tender area on the bottom of your foot.  A high arch in your foot.  Pain when you move your foot.  Difficulty moving your foot. You may have imaging tests to confirm the diagnosis, such as:  X-rays.  Ultrasound.  MRI. How is this treated? Treatment for plantar fasciitis depends on how severe your condition is. Treatment may include:  Rest, ice, applying pressure (compression), and raising the affected foot (elevation). This may be called RICE therapy. Your health care provider may recommend RICE therapy along with over-the-counter pain medicines to manage your pain.  Exercises to stretch your calves and your plantar fascia.  A splint that holds your foot in a stretched, upward position while you sleep (night splint).  Physical therapy to relieve symptoms and prevent problems in the future.  Injections of steroid medicine (cortisone) to relieve pain and inflammation.  Stimulating your plantar fascia with electrical impulses (extracorporeal shock wave therapy). This is usually the last treatment option before surgery.  Surgery, if other treatments have not worked after 12 months. Follow these instructions at home:  Managing pain, stiffness, and swelling  If directed, put ice on the painful area: ? Put ice in a plastic bag, or use a frozen bottle of water. ? Place a towel between your skin and the bag or bottle. ? Roll the bottom of your foot over the bag or bottle. ? Do this  for 20 minutes, 2-3 times a day.  Wear athletic shoes that have air-sole or gel-sole cushions, or try wearing soft shoe inserts that are designed for plantar fasciitis.  Raise (elevate) your foot above the level of your heart while you are sitting or lying down. Activity  Avoid activities that cause pain. Ask your health care provider what activities are safe for you.  Do physical therapy exercises and stretches as told by  your health care provider.  Try activities and forms of exercise that are easier on your joints (low-impact). Examples include swimming, water aerobics, and biking. General instructions  Take over-the-counter and prescription medicines only as told by your health care provider.  Wear a night splint while sleeping, if told by your health care provider. Loosen the splint if your toes tingle, become numb, or turn cold and blue.  Maintain a healthy weight, or work with your health care provider to lose weight as needed.  Keep all follow-up visits as told by your health care provider. This is important. Contact a health care provider if you:  Have symptoms that do not go away after caring for yourself at home.  Have pain that gets worse.  Have pain that affects your ability to move or do your daily activities. Summary  Plantar fasciitis is a painful foot condition that affects the heel. It occurs when the band of tissue that connects the toes to the heel bone (plantar fascia) becomes irritated.  The main symptom of this condition is heel pain that may be worse after exercising too much or standing still for a long time.  Treatment varies, but it usually starts with rest, ice, compression, and elevation (RICE therapy) and over-the-counter medicines to manage pain. This information is not intended to replace advice given to you by your health care provider. Make sure you discuss any questions you have with your health care provider. Document Revised: 05/12/2017 Document Reviewed: 03/27/2017 Elsevier Patient Education  2020 [employer].

## 2020-02-12 NOTE — Progress Notes (Addendum)
    SUBJECTIVE:   CHIEF COMPLAINT / HPI:   Kristy Johnson is a 44 yr old female who presents today for a physical and heel pain.  Current concerns include:  Heel pain Pt endorses bilateral heel pain for one month. Worse on waking up and worse after weight bearing. Pain is worse on left heels Vs the right. Throbbing and intermittent in nature. Exacerbated by weight bearing. Pt has history of similar heel pain in 2017 but went away when she started wearing croc slippers. 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 She thinks she was not wearing the right foot wear.  Has not taken analgesia for the pain.  Has tried rolling ball with spikes on it over the feet.  Denies falls or trauma to lower extremities.  Denies history of steroid injections in feet.  PERTINENT  PMH / PSH: nil   OBJECTIVE:   BP 102/62   Pulse 71   Ht 5\' 4"  (1.626 m)   Wt 253 lb (114.8 kg)   LMP 02/05/2020 (Exact Date)   SpO2 96%   BMI 43.43 kg/m    General: Alert, no acute distress, well appearing AA female  Cardio: Well perfused Pulm: Normal respiratory effort Extremities: Mild non-pitting edema bilaterally. Warm/ well perfused. No obvious deformities in LE. Normal dorsiflexion, plantar flexion, inversion and eversion. Normal sensation. Dorsalis pedis pulses present bilaterally. Tenderness on palpation of medial left heel. Antalgic gait.  Neuro: Cranial nerves grossly intact  ASSESSMENT/PLAN:   Plantar fasciitis of left foot Most likely differential is plantar fascitis given medial heel pain worse on ambulating and worse in the mornings. Examination findings also consistent with plantar fascitis-tenderness over medial aspect of left heel. Considered calcaneal stress fracture however no hx of falls or trauma to area. Also considered achilles tendinitis however less likely due to lack of hx of activities related to this such as running, cycling etc etc.  Recommended: -Ice therapy over affected area approx 3 times a day -NSAIDs: Motrin 400mg  TID -Night splint -Correct footwear -To wear compression stockings at work  -Foot exercises to stretch plantar fascia -Follow up with me in 1-2 months. If no improvement could consider steroid injections or referral to sports medicine.  Healthcare maintenance Obtained hepatitis C antibody level for screening.      Lattie Haw, MD PGY2 [facility]

## 2020-02-13 ENCOUNTER — Encounter: Payer: Self-pay | Admitting: Family Medicine

## 2020-02-13 DIAGNOSIS — M722 Plantar fascial fibromatosis: Secondary | ICD-10-CM | POA: Insufficient documentation

## 2020-02-13 DIAGNOSIS — Z0000 Encounter for general adult medical examination without abnormal findings: Secondary | ICD-10-CM | POA: Insufficient documentation

## 2020-02-13 LAB — HEPATITIS C ANTIBODY: Hep C Virus Ab: 0.2 s/co ratio (ref 0.0–0.9)

## 2020-02-13 NOTE — Assessment & Plan Note (Signed)
Most likely differential is plantar fascitis given medial heel pain worse on ambulating and worse in the mornings. Examination findings also consistent with plantar fascitis-tenderness over medial aspect of left heel. Considered calcaneal stress fracture however no hx of falls or trauma to area. Also considered achilles tendinitis however less likely due to lack of hx of activities related to this such as running, cycling etc etc. Recommended: -Ice therapy over affected area approx 3 times a day -NSAIDs: Motrin 400mg  TID -Night splint -Correct footwear -To wear compression stockings at work  -Foot exercises to stretch plantar fascia -Follow up with me in 1-2 months. If no improvement could consider steroid injections or referral to sports medicine.

## 2020-02-13 NOTE — Assessment & Plan Note (Signed)
Obtained hepatitis C antibody level for screening.

## 2020-02-28 ENCOUNTER — Ambulatory Visit
Admission: RE | Admit: 2020-02-28 | Discharge: 2020-02-28 | Disposition: A | Discharge status: Home / Self Care | Payer: No Typology Code available for payment source | Source: Ambulatory Visit | Attending: Obstetrics and Gynecology | Admitting: Obstetrics and Gynecology

## 2020-02-28 ENCOUNTER — Other Ambulatory Visit: Payer: Self-pay

## 2020-02-28 DIAGNOSIS — R922 Inconclusive mammogram: Secondary | ICD-10-CM | POA: Diagnosis not present

## 2020-02-28 DIAGNOSIS — N6312 Unspecified lump in the right breast, upper inner quadrant: Secondary | ICD-10-CM | POA: Diagnosis not present

## 2020-02-28 DIAGNOSIS — D369 Benign neoplasm, unspecified site: Secondary | ICD-10-CM

## 2020-06-13 DIAGNOSIS — N920 Excessive and frequent menstruation with regular cycle: Secondary | ICD-10-CM

## 2020-06-13 HISTORY — DX: Excessive and frequent menstruation with regular cycle: N92.0

## 2020-07-02 NOTE — H&P (Signed)
Kristy Johnson MEDICAL RECORD IR:[id number] ACCOUNT [id number] DATE OF BIRTH:[birth year] FACILITY: [facility] LOCATION:  PHYSICIAN:Meade Hogeland Garry Heater, MD  HISTORY AND PHYSICAL  DATE OF ADMISSION:  07/20/2020  Date of scheduled surgery at [facility] is 07/20/2020.  CHIEF COMPLAINT:  Symptomatic leiomyoma.  HISTORY OF PRESENT ILLNESS:  A 45 year old, G3, P3, prior postpartum tubal, known history of leiomyoma, anemia, menorrhagia, and dysmenorrhea presents for definitive hysterectomy.  Her saline ultrasound on 01/20/2020 showed intramural fibroids 3.2, 1.7,  and 4.0.  No other adnexal abnormalities were noted.  This procedure including risks regarding bleeding, infection, transfusion, wound infection, phlebitis, adjacent organ injury, the possible need to complete the surgery by open technique discussed.  Also, the rationale for bilateral salpingectomy  discussed with her with the plan to preserve ovaries if otherwise normal.  We also discussed her expected recovery time, limitations back to work, etc.  PAST MEDICAL HISTORY:  ALLERGIES:  None.  CURRENT MEDICATIONS:  None.  OBSTETRICAL HISTORY:  She has had three vaginal deliveries.  FAMILY HISTORY:  Significant for father with hypertension.  SOCIAL HISTORY:  She works as a Quarry manager, is separated.  She is a nonsmoker, occasional alcohol use.  PAST SURGICAL HISTORY:  Positive only for three vaginal deliveries and postpartum tubal ligation.  PHYSICAL EXAMINATION: VITAL SIGNS:  Blood pressure 122/82, weight was 250. HEENT:  Unremarkable. NECK:  Supple without masses.  Thyroid nonpalpable. HEART AND LUNGS:  Clear. BREASTS:  Without masses, tenderness, or nipple discharge. ABDOMEN:  Soft, flat, nontender. PELVIC:  Vulva, vagina, cervix normal.  Last Pap normal in 2018.  Uterus was 10-week size, mobile.  Adnexa negative. EXTREMITIES:  Unremarkable. NEUROLOGIC:  Unremarkable.  IMPRESSION:  Symptomatic leiomyoma.  PLAN:  LAVH,  bilateral salpingectomy.  Procedure and risks discussed as above.  IN/NUANCE  D:07/01/2020 T:07/01/2020 JOB:[id number]

## 2020-07-09 ENCOUNTER — Encounter (HOSPITAL_COMMUNITY): Admission: RE | Admit: 2020-07-09 | Payer: BC Managed Care – PPO | Source: Ambulatory Visit

## 2020-07-13 DIAGNOSIS — D259 Leiomyoma of uterus, unspecified: Secondary | ICD-10-CM | POA: Diagnosis not present

## 2020-07-17 ENCOUNTER — Other Ambulatory Visit (HOSPITAL_COMMUNITY): Payer: BC Managed Care – PPO

## 2020-07-17 ENCOUNTER — Encounter (HOSPITAL_COMMUNITY): Admission: RE | Admit: 2020-07-17 | Payer: 59 | Source: Ambulatory Visit

## 2020-07-20 ENCOUNTER — Ambulatory Visit (HOSPITAL_BASED_OUTPATIENT_CLINIC_OR_DEPARTMENT_OTHER)
Admission: RE | Admit: 2020-07-20 | Payer: BC Managed Care – PPO | Source: Home / Self Care | Admitting: Obstetrics and Gynecology

## 2020-07-20 SURGERY — HYSTERECTOMY, VAGINAL, LAPAROSCOPY-ASSISTED, WITH SALPINGECTOMY
Anesthesia: General | Laterality: Bilateral

## 2020-10-02 ENCOUNTER — Encounter (HOSPITAL_COMMUNITY): Payer: Self-pay | Admitting: Emergency Medicine

## 2020-10-02 ENCOUNTER — Ambulatory Visit (HOSPITAL_COMMUNITY)
Admission: EM | Admit: 2020-10-02 | Discharge: 2020-10-02 | Disposition: A | Discharge status: Home / Self Care | Payer: BC Managed Care – PPO | Attending: Medical Oncology | Admitting: Medical Oncology

## 2020-10-02 ENCOUNTER — Other Ambulatory Visit: Payer: Self-pay

## 2020-10-02 DIAGNOSIS — J069 Acute upper respiratory infection, unspecified: Secondary | ICD-10-CM | POA: Diagnosis not present

## 2020-10-02 DIAGNOSIS — R0981 Nasal congestion: Secondary | ICD-10-CM | POA: Diagnosis not present

## 2020-10-02 DIAGNOSIS — Z20822 Contact with and (suspected) exposure to covid-19: Secondary | ICD-10-CM | POA: Diagnosis not present

## 2020-10-02 LAB — SARS CORONAVIRUS 2 (TAT 6-24 HRS): SARS Coronavirus 2: NEGATIVE

## 2020-10-02 MED ORDER — BENZONATATE 100 MG PO CAPS: 100.0000 mg | ORAL_CAPSULE | Freq: Three times a day (TID) | ORAL | 0 refills | Status: DC

## 2020-10-02 MED ORDER — FLUTICASONE PROPIONATE 50 MCG/ACT NA SUSP: 2.0000 | Freq: Every day | NASAL | 0 refills | Status: DC

## 2020-10-02 NOTE — ED Triage Notes (Signed)
Pt presents today with nasal congestion and loss of taste and smell x 5 days. She has been unable to eat, but did take vitamins this morning, which have made her nauseated. Denies fever.

## 2020-10-02 NOTE — ED Provider Notes (Signed)
[facility]    CSN: [id number] Arrival date & time: 10/02/20  5784      History   Chief Complaint Chief Complaint  Patient presents with  . Nasal Congestion    HPI Kristy Johnson is a 45 y.o. female.   HPI  Nasal Congestion: Patient reports that for the past 5 days she has had nasal congestion as well as loss of taste and smell.  She also has not had an appetite but has been able to drink fluids.  She has some mild nausea this morning but attributes it to taking her vitamins on an empty stomach.  She denies any known fevers (but has had chills), chest pain, shortness of breath.  No known sick contacts.    Past Medical History:  Diagnosis Date  . AMA (advanced maternal age) multigravida 85+   . Anemia   . Fibroid   . Headache   . Medical history non-contributory     Patient Active Problem List   Diagnosis Date Noted  . Plantar fasciitis of left foot 02/13/2020  . Healthcare maintenance 02/13/2020  . Iron deficiency anemia due to chronic blood loss 10/17/2017  . Pregnancy 09/29/2017  . Viral illness 07/16/2017  . Abdominal pain 07/16/2017  . Viral URI with cough 07/16/2017  . NECK SPRAIN AND STRAIN 12/23/2009  . THORACIC SPRAIN AND STRAIN 12/23/2009    Past Surgical History:  Procedure Laterality Date  . NO PAST SURGERIES    . TUBAL LIGATION Bilateral 09/30/2017   Procedure: POST PARTUM TUBAL LIGATION;  Surgeon: Molli Posey, MD;  Location: [facility];  Service: Gynecology;  Laterality: Bilateral;    OB History    Gravida  4   Para  3   Term  3   Preterm      AB  1   Living  3     SAB      IAB  1   Ectopic      Multiple  0   Live Births  3            Home Medications    Prior to Admission medications   Medication Sig Start Date End Date Taking? Authorizing Provider  loratadine (CLARITIN) 10 MG tablet Take 10 mg by mouth daily.   Yes [provider]  Multiple Vitamin (MULTIVITAMIN) tablet Take 1  tablet by mouth daily.   Yes [provider]  acetaminophen (TYLENOL) 500 MG tablet Take 500-1,000 mg by mouth every 6 (six) hours as needed (headache/body aches.).    [provider]  ferrous sulfate 325 (65 FE) MG tablet Take 325 mg by mouth 3 (three) times a week.    [provider]  ketotifen (ZADITOR) 0.025 % ophthalmic solution Place 1 drop into both eyes 2 (two) times daily as needed (allergy eyes.).    [provider]    Family History Family History  Problem Relation Age of Onset  . Hypertension Maternal Grandmother   . Hypertension Father   . Asthma Maternal Grandfather     Social History Social History   Tobacco Use  . Smoking status: Never Smoker  . Smokeless tobacco: Never Used  Substance Use Topics  . Alcohol use: No  . Drug use: No     Allergies   Patient has no known allergies.   Review of Systems Review of Systems  As stated above in HPI Physical Exam Triage Vital Signs ED Triage Vitals  Enc Vitals Group  BP 10/02/20 0938 (!) 131/96     Pulse Rate 10/02/20 0938 94     Resp 10/02/20 0938 18     Temp 10/02/20 0938 98.6 F (37 C)     Temp Source 10/02/20 0938 Oral     SpO2 10/02/20 0938 100 %     Weight --      Height --      Head Circumference --      Peak Flow --      Pain Score 10/02/20 0934 2     Pain Loc --      Pain Edu? --      Excl. in [place]? --    No data found.  Updated Vital Signs BP (!) 131/96 (BP Location: Right Arm)   Pulse 94   Temp 98.6 F (37 C) (Oral)   Resp 18   LMP 09/15/2020 (Approximate)   SpO2 100%   Physical Exam Vitals and nursing note reviewed.  Constitutional:      General: She is not in acute distress.    Appearance: Normal appearance. She is not ill-appearing, toxic-appearing or diaphoretic.  HENT:     Head: Normocephalic and atraumatic.     Right Ear: Tympanic membrane, ear canal and external ear normal.     Left Ear: Tympanic membrane, ear canal and external ear  normal.     Nose: Congestion and rhinorrhea present.     Mouth/Throat:     Mouth: Mucous membranes are dry.     Pharynx: No oropharyngeal exudate or posterior oropharyngeal erythema.  Eyes:     Extraocular Movements: Extraocular movements intact.     Pupils: Pupils are equal, round, and reactive to light.  Cardiovascular:     Rate and Rhythm: Normal rate and regular rhythm.     Heart sounds: Normal heart sounds.  Pulmonary:     Effort: Pulmonary effort is normal.     Breath sounds: Normal breath sounds.  Musculoskeletal:     Cervical back: Normal range of motion and neck supple.  Skin:    General: Skin is warm.  Neurological:     Mental Status: She is alert and oriented to person, place, and time.      UC Treatments / Results  Labs (all labs ordered are listed, but only abnormal results are displayed) Labs Reviewed  SARS CORONAVIRUS 2 (TAT 6-24 HRS)    EKG   Radiology No results found.  Procedures Procedures (including critical care time)  Medications Ordered in UC Medications - No data to display  Initial Impression / Assessment and Plan / UC Course  I have reviewed the triage vital signs and the nursing notes.  Pertinent labs & imaging results that were available during my care of the patient were reviewed by me and considered in my medical decision making (see chart for details).     New.  Likely COVID versus flu which I discussed with patient.  She is past treatment with Tamiflu.  COVID testing pending.  Sending in [place] and [place] to help in the meantime.  Discussed red flag signs and symptoms.  I have also recommended that she obtain a pulse oximeter to monitor her oxygen although her oxygen is 100% here in office today. Final Clinical Impressions(s) / UC Diagnoses   Final diagnoses:  None   Discharge Instructions   None    ED Prescriptions    None     PDMP not reviewed this encounter.   Hughie Closs, [place] 10/02/20 1039

## 2020-11-16 ENCOUNTER — Ambulatory Visit: Payer: BC Managed Care – PPO

## 2020-12-14 NOTE — Progress Notes (Addendum)
SUBJECTIVE:   Chief compliant/HPI: annual examination  Kristy Johnson is a 45 y.o. who presents today for an annual exam.   Bilateral plantar pain Medial bilateral plantar pain for the last 6 months. 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  Her job involves standing all day. Pain is worse when she wakes up in the mornings and improves throughout the day. No falls or injuries recently. Has tried cold compresses which helps sometimes. Ibuprofen has not helped much.   Weight loss counseling Pt's ideal weight is less than 200lb. Recently separated and has been gaining weight. Has had 3 children and found it hard to lose the weight after their deliveries.  She would like to try a weight loss medication today.  History tabs reviewed and updated.  Review of systems form reviewed.   [place] Visit from 12/15/2020 in [place]  PHQ-9 Total Score 0       OBJECTIVE:   BP 102/66   Pulse 66   Ht 5\' 4"  (1.626 m)   Wt 255 lb 6.4 oz (115.8 kg)   LMP 12/05/2020   SpO2 99%   BMI 43.84 kg/m    General: Alert, no acute distress, pleasant Cardio: Normal S1 and S2, RRR, no r/m/g Pulm: CTAB, normal work of breathing Abdomen: Bowel sounds normal. Abdomen soft and non-tender.  Extremities: No peripheral edema.  Neuro: Cranial nerves grossly intact   Foot: Inspection:  No obvious bony deformity.  No swelling, erythema, or bruising.  Normal arch Palpation: No tenderness to palpation ROM: Full  ROM of the ankle. Normal midfoot flexibility Strength: 5/5 strength ankle in all planes Neurovascular: N/V intact distally in the lower extremity Special tests: Negative anterior drawer. Negative squeeze. normal midfoot flexibility. Normal calcaneal motion with heel raise   ASSESSMENT/PLAN:   Healthcare maintenance Obtained a1c, lipid panel, BMP, Hep b surface antigen today.Also referred for colonoscopy.  Also provided patient with advanced directive  booklet.  BMI 40.0-44.9, adult (HCC) Discussed diet and exercise options for weight loss.  Also discussed starting GLP-1 agonist such as NOMVEH.  Patient would like to start [facility].  No contraindications, discussed side effects.  Started 0.25 mg Wegovy weekly.  Follow-up in 4 weeks where we will increase the dose to 0.5 mg.  Plantar fasciitis, bilateral Medial bilateral foot pain on exam today consistent with recurrence of plantar fasciitis. Last year it was on the left foot but now it is bilateral.  Recommend: -Rest is much as possible -Ice therapy 3-4 times a day -Ibuprofen 400 mg 3 times daily, Tylenol as needed -Night splint/strassburg sock -Adequate footwear -Foot stretches -If no improvement consider referral to sports medicine for steroid injections    Annual Examination  See AVS for recommendations.  PHQ score 0, reviewed.  Blood pressure value is at goal.  Advanced directives reviewe.   Considered the following screening exams based upon USPSTF recommendations: Diabetes screening: ordered Screening for elevated cholesterol: will order at next visit  HIV testing: not ordered  Hepatitis C:  will order at next visit  Hepatitis B: ordered Syphilis if at high risk:  not sexually active for the last 3 years Reviewed risk factors for latent tuberculosis and not indicated Colorectal cancer screening: discussed, colonoscopy ordered if age 25 or over or risk factors.  Immunizations up to date except TDAP.   Follow up in 1  year or sooner if indicated.   Lattie Haw, MD [facility]

## 2020-12-15 ENCOUNTER — Ambulatory Visit (INDEPENDENT_AMBULATORY_CARE_PROVIDER_SITE_OTHER): Payer: BC Managed Care – PPO | Admitting: Family Medicine

## 2020-12-15 ENCOUNTER — Other Ambulatory Visit: Payer: Self-pay

## 2020-12-15 VITALS — BP 102/66 | HR 66 | Ht 64.0 in | Wt 255.4 lb

## 2020-12-15 DIAGNOSIS — Z23 Encounter for immunization: Secondary | ICD-10-CM

## 2020-12-15 DIAGNOSIS — Z0000 Encounter for general adult medical examination without abnormal findings: Secondary | ICD-10-CM

## 2020-12-15 DIAGNOSIS — M722 Plantar fascial fibromatosis: Secondary | ICD-10-CM

## 2020-12-15 DIAGNOSIS — Z131 Encounter for screening for diabetes mellitus: Secondary | ICD-10-CM

## 2020-12-15 DIAGNOSIS — Z6841 Body Mass Index (BMI) 40.0 and over, adult: Secondary | ICD-10-CM | POA: Diagnosis not present

## 2020-12-15 DIAGNOSIS — Z1211 Encounter for screening for malignant neoplasm of colon: Secondary | ICD-10-CM | POA: Diagnosis not present

## 2020-12-15 LAB — POCT GLYCOSYLATED HEMOGLOBIN (HGB A1C): Hemoglobin A1C: 5.8 % — AB (ref 4.0–5.6)

## 2020-12-15 NOTE — Assessment & Plan Note (Addendum)
Obtained a1c. Will obtained BMP, lipid panel and hep B at next visit. Also referred for colonoscopy.  Also provided patient with advanced directive booklet.

## 2020-12-15 NOTE — Assessment & Plan Note (Signed)
Discussed diet and exercise options for weight loss.  Also discussed starting GLP-1 agonist such as AXENMM.  Patient would like to start [place].  No contraindications, discussed side effects.  Started 0.25 mg Wegovy weekly.  Follow-up in 4 weeks where we will increase the dose to 0.5 mg.

## 2020-12-15 NOTE — Assessment & Plan Note (Addendum)
Medial bilateral foot pain on exam today consistent with recurrence of plantar fasciitis. Last year it was on the left foot but now it is bilateral.  Recommend: -Rest is much as possible -Ice therapy 3-4 times a day -Ibuprofen 400 mg 3 times daily, Tylenol as needed -Night splint/strassburg sock -Adequate footwear -Foot stretches -If no improvement consider referral to sports medicine for steroid injections

## 2020-12-15 NOTE — Assessment & Plan Note (Deleted)
Recurrence of plantar fasciitis.  Last year it was on the left foot but now it is bilateral.

## 2020-12-15 NOTE — Patient Instructions (Signed)
    Thank you for coming to see me today. It was a pleasure. Today we discussed your heel pain.I think it is plantar fascitis. I recommend ice therapy, tylenol, ibuprofen during painful episodes, rest etc buy the sock for plantar fascitis.started you on wegovy 0.25mg  weekly. We will increase this dose as needed.   Please follow-up with me in 4 weeks   If you have any questions or concerns, please do not hesitate to call the office at [phone].  Best wishes,   Dr Posey Pronto

## 2021-01-14 ENCOUNTER — Ambulatory Visit: Payer: BC Managed Care – PPO | Admitting: Family Medicine

## 2021-01-14 NOTE — Progress Notes (Deleted)
     SUBJECTIVE:   CHIEF COMPLAINT / HPI:   Kristy Johnson is a 45 y.o. female presents for follow up   Weight loss counseling Seen one month ago for weight loss counseling. Started on Wegovy. Diet:   Exercise:   Plantar fasciitis, bilateral One month was diagnosed with plantar fascitis. Recommended supportive management. Symptoms have improved ***  Medial bilateral foot pain on exam today consistent with recurrence of plantar fasciitis. Last year it was on the left foot but now it is bilateral.  Recommend: -Rest is much as possible -Ice therapy 3-4 times a day -Ibuprofen 400 mg 3 times daily, Tylenol as needed -Night splint/strassburg sock -Adequate footwear -Foot stretches -If no improvement consider referral to sports medicine for steroid injections     ***  [place] Office Visit from 12/15/2020 in [place]  PHQ-9 Total Score 0        Health Maintenance Due  Topic   COVID-19 Vaccine (3 - Booster for Moderna series)   COLONOSCOPY (Pts 45-13yr Insurance coverage will need to be confirmed)    IGrant PMH / PSH:   OBJECTIVE:   There were no vitals taken for this visit.   General: Alert, no acute distress Cardio: Normal S1 and S2, RRR, no r/m/g Pulm: CTAB, normal work of breathing Abdomen: Bowel sounds normal. Abdomen soft and non-tender.  Extremities: No peripheral edema.  Neuro: Cranial nerves grossly intact   ASSESSMENT/PLAN:   No problem-specific Assessment & Plan notes found for this encounter.     PLattie Haw MD PGY-2 [place]

## 2021-05-10 DIAGNOSIS — Z6841 Body Mass Index (BMI) 40.0 and over, adult: Secondary | ICD-10-CM | POA: Diagnosis not present

## 2021-05-10 DIAGNOSIS — D649 Anemia, unspecified: Secondary | ICD-10-CM | POA: Diagnosis not present

## 2021-05-10 DIAGNOSIS — Z01419 Encounter for gynecological examination (general) (routine) without abnormal findings: Secondary | ICD-10-CM | POA: Diagnosis not present

## 2021-05-18 DIAGNOSIS — Z1231 Encounter for screening mammogram for malignant neoplasm of breast: Secondary | ICD-10-CM | POA: Diagnosis not present

## 2021-05-26 DIAGNOSIS — D259 Leiomyoma of uterus, unspecified: Secondary | ICD-10-CM | POA: Diagnosis not present

## 2021-05-26 DIAGNOSIS — D649 Anemia, unspecified: Secondary | ICD-10-CM | POA: Diagnosis not present

## 2021-05-26 DIAGNOSIS — N920 Excessive and frequent menstruation with regular cycle: Secondary | ICD-10-CM | POA: Diagnosis not present

## 2021-07-23 ENCOUNTER — Other Ambulatory Visit: Payer: Self-pay

## 2021-07-23 ENCOUNTER — Encounter (HOSPITAL_BASED_OUTPATIENT_CLINIC_OR_DEPARTMENT_OTHER): Payer: Self-pay | Admitting: Obstetrics and Gynecology

## 2021-07-23 NOTE — Progress Notes (Signed)
Spoke w/ via phone for pre-op interview---pt Lab needs dos----urine pregnancy POCT per anesthesia, surgeon orders pending as of 07/23/21             Lab results------Lab appt on 08/05/21 for CBC, type & screen COVID test -----Patient will get a Covid test on 08/05/21 due to having a cold-like illness that began on 07/17/21. As of 07/23/21, patient is much better but still has a cough. Arrive at -------0530 on Monday, 08/09/21 NPO after MN NO Solid Food.  Clear liquids from MN until---0430 Med rec completed Medications to take morning of surgery -----Zadiator eye gtts, Claritin, Tessalon capsule prn Diabetic medication -----n/a Patient instructed no nail polish to be worn day of surgery Patient instructed to bring photo id and insurance card day of surgery Patient aware to have Driver (ride ) / caregiver    for 24 hours after surgery - friend Chad Patient Special Instructions -----Extended recovery instructions given. Pre-Op special Istructions -----Patient is from [place] and speaks English very well. She declined the need for an interpreter. Requested orders via Epic IB from Dr. Matthew Saras on 07/23/21. Patient verbalized understanding of instructions that were given at this phone interview. Patient denies shortness of breath, chest pain, fever, cough at this phone interview.

## 2021-07-23 NOTE — Progress Notes (Signed)
PLEASE WEAR A MASK OUT IN PUBLIC AND SOCIAL DISTANCE AND [place] YOUR HANDS FREQUENTLY. PLEASE ASK ALL YOUR CLOSE HOUSEHOLD CONTACT TO WEAR MASK OUT IN PUBLIC AND SOCIAL DISTANCE AND [place] HANDS FREQUENTLY ALSO.      Your procedure is scheduled on Monday, 08/09/21.  Report to [facility] AT  5:30 am.   Call this number if you have problems the morning of surgery  [phone].   OUR ADDRESS IS [place].  WE ARE LOCATED IN [facility].  PLEASE BRING YOUR INSURANCE CARD AND PHOTO ID DAY OF SURGERY.  ONLY ONE PERSON ALLOWED IN FACILITY WAITING AREA.                                     REMEMBER:  DO NOT EAT FOOD, CANDY GUM OR MINTS  AFTER MIDNIGHT THE NIGHT BEFORE YOUR SURGERY . YOU MAY HAVE CLEAR LIQUIDS FROM MIDNIGHT THE NIGHT BEFORE YOUR SURGERY UNTIL  4:30 am. NO CLEAR LIQUIDS AFTER   4:30 am DAY OF SURGERY.   YOU MAY  BRUSH YOUR TEETH MORNING OF SURGERY AND RINSE YOUR MOUTH OUT, NO CHEWING GUM CANDY OR MINTS.    CLEAR LIQUID DIET   Foods Allowed                                                                     Foods Excluded  Coffee and tea, regular and decaf                             liquids that you cannot  Plain Jell-O any favor except red or purple                                           see through such as: Fruit ices (not with fruit pulp)                                     milk, soups, orange juice  Iced Popsicles                                    All solid food Carbonated beverages, regular and diet                                    Cranberry, grape and apple juices Sports drinks like Gatorade  Sample Menu Breakfast                                Lunch  Supper Cranberry juice                                           Jell-O                                     Grape juice                           Apple juice Coffee or tea                        Jell-O                                       Popsicle                                                Coffee or tea                        Coffee or tea  _____________________________________________________________________     TAKE THESE MEDICATIONS MORNING OF SURGERY WITH A SIP OF WATER:  Claritin,Tessalon capsule, Zaditor eye gtts  ONE VISITOR IS ALLOWED IN WAITING ROOM ONLY DAY OF SURGERY.  YOU MAY HAVE ANOTHER PERSON SWITCH OUT WITH THE  1  VISITOR IN THE WAITING ROOM DAY OF SURGERY AND A MASK MUST BE WORN IN THE WAITING ROOM.    2 VISITORS  MAY VISIT IN THE EXTENDED RECOVERY ROOM UNTIL 800 PM ONLY 1 VISITOR AGE 43 AND OVER MAY SPEND THE NIGHT AND MUST BE IN EXTENDED RECOVERY ROOM NO LATER THAN 800 PM .    UP TO 2 CHILDREN AGE 13 TO 15 MAY ALSO VISIT IN EXTENDED RECOV ERY ROOM ONLY UNTIL 800 PM AND MUST LEAVE BY 800 PM.   ALL PERSONS VISITING IN EXTENDED RECOVERY ROOM MUST WEAR A MASK.                                    DO NOT WEAR JEWERLY, MAKE UP. DO NOT WEAR LOTIONS, POWDERS, PERFUMES OR NAIL POLISH ON YOUR FINGERNAILS. TOENAIL POLISH IS OK TO WEAR. DO NOT SHAVE FOR 48 HOURS PRIOR TO DAY OF SURGERY. MEN MAY SHAVE FACE AND NECK. CONTACTS, GLASSES, OR DENTURES MAY NOT BE WORN TO SURGERY.                                    [facility] IS NOT RESPONSIBLE  FOR ANY BELONGINGS.                                                                    Marland Kitchen  [place] - Preparing for Surgery Before surgery, you can play an important role.  Because skin is not sterile, your skin needs to be as free of germs as possible.  You can reduce the number of germs on your skin by washing with CHG (chlorahexidine gluconate) soap before surgery.  CHG is an antiseptic cleaner which kills germs and bonds with the skin to continue killing germs even after washing. Please DO NOT use if you have an allergy to CHG or antibacterial soaps.  If your skin becomes reddened/irritated stop using the CHG and inform your nurse when you arrive at Short  Stay. Do not shave (including legs and underarms) for at least 48 hours prior to the first CHG shower.  You may shave your face/neck. Please follow these instructions carefully:  1.  Shower with CHG Soap the night before surgery and the  morning of Surgery.  2.  If you choose to wash your hair, wash your hair first as usual with your  normal  shampoo.  3.  After you shampoo, rinse your hair and body thoroughly to remove the  shampoo.                            4.  Use CHG as you would any other liquid soap.  You can apply chg directly  to the skin and wash                      Gently with a scrungie or clean washcloth.  5.  Apply the CHG Soap to your body ONLY FROM THE NECK DOWN.   Do not use on face/ open                           Wound or open sores. Avoid contact with eyes, ears mouth and genitals (private parts).                       Wash face,  Genitals (private parts) with your normal soap.             6.  Wash thoroughly, paying special attention to the area where your surgery  will be performed.  7.  Thoroughly rinse your body with warm water from the neck down.  8.  DO NOT shower/wash with your normal soap after using and rinsing off  the CHG Soap.                9.  Pat yourself dry with a clean towel.            10.  Wear clean pajamas.            11.  Place clean sheets on your bed the night of your first shower and do not  sleep with pets. Day of Surgery : Do not apply any lotions/deodorants the morning of surgery.  Please wear clean clothes to the hospital/surgery center.  IF YOU HAVE ANY SKIN IRRITATION OR PROBLEMS WITH THE SURGICAL SOAP, PLEASE GET A BAR OF GOLD DIAL SOAP AND SHOWER THE NIGHT BEFORE YOUR SURGERY AND THE MORNING OF YOUR SURGERY. PLEASE LET THE NURSE KNOW MORNING OF YOUR SURGERY IF YOU HAD ANY PROBLEMS WITH THE SURGICAL SOAP.   ________________________________________________________________________  QUESTIONS CALL Nahmir Zeidman PRE OP NURSE PHONE [phone].

## 2021-07-29 NOTE — H&P (Signed)
NAMEKRISTENA, Kristy Johnson ACCOUNT NO: [id number] DATE OF BIRTH: 1975-09-14 PHYSICIAN: Ralene Bathe. Matthew Saras, MD  History and Physical   DATE OF ADMISSION: 08/09/2021  Upcoming surgery scheduled at [facility] on 02/27.  CHIEF COMPLAINT:  Menorrhagia and anemia secondary to fibroids.  HISTORY OF PRESENT ILLNESS:  A 46 year old, separated, G3 P3, she has had 3 prior vaginal deliveries and also in 2019 tubal ligation.  She has struggled with menorrhagia and anemia secondary to fibroids, her last ultrasound in our office dated 01/20/2020  showed intramural fibroids 2.2, 1.7 and a 4 cm subserosal fibroid.  She presents at this time for definitive LAVH bilateral salpingectomy.  This procedure including specific risks regarding bleeding, infection, transfusion, wound infection, phlebitis,  expected recovery time, the rationale for bilateral salpingectomy are all reviewed.  Additionally, she understands that she may require open additional surgery to complete.  PAST MEDICAL HISTORY:  ____  ALLERGIES:  None.  CURRENT MEDICATIONS:  Iron.  FAMILY HISTORY:  Significant for father and grandmother with hypertension and a grandfather with asthma.  PHYSICAL EXAMINATION: VITAL SIGNS:  Weight 257, blood pressure 132/82. HEENT:  Unremarkable. NECK:  Supple, without masses.  Thyroid is nonpalpable. HEART AND LUNGS:  Clear. BREASTS:  Without masses, tenderness, or nipple discharge. ABDOMEN:  Soft, flat, nontender. PELVIC:  Vulva, vagina, cervix, normal.  Uterus upper limit of normal size.  Adnexa negative. EXTREMITIES AND NEUROLOGIC:  Unremarkable.  IMPRESSION:  Symptomatic leiomyoma.  PLAN:  LAVH, bilateral salpingectomy.  Procedure and risks were reviewed as above.    CHR D: 07/28/2021 10:30:22 am T: 07/28/2021 12:17:00 pm  JOB: [id number]/ [id number]

## 2021-08-02 DIAGNOSIS — D252 Subserosal leiomyoma of uterus: Secondary | ICD-10-CM | POA: Diagnosis not present

## 2021-08-02 DIAGNOSIS — N924 Excessive bleeding in the premenopausal period: Secondary | ICD-10-CM | POA: Diagnosis not present

## 2021-08-02 DIAGNOSIS — D649 Anemia, unspecified: Secondary | ICD-10-CM | POA: Diagnosis not present

## 2021-08-02 DIAGNOSIS — D251 Intramural leiomyoma of uterus: Secondary | ICD-10-CM | POA: Diagnosis not present

## 2021-08-05 ENCOUNTER — Encounter (HOSPITAL_COMMUNITY)
Admission: RE | Admit: 2021-08-05 | Discharge: 2021-08-05 | Disposition: A | Discharge status: Home / Self Care | Payer: BC Managed Care – PPO | Source: Ambulatory Visit | Attending: Obstetrics and Gynecology | Admitting: Obstetrics and Gynecology

## 2021-08-05 ENCOUNTER — Other Ambulatory Visit: Payer: Self-pay | Admitting: Obstetrics and Gynecology

## 2021-08-05 ENCOUNTER — Other Ambulatory Visit: Payer: Self-pay

## 2021-08-05 DIAGNOSIS — D250 Submucous leiomyoma of uterus: Secondary | ICD-10-CM | POA: Diagnosis not present

## 2021-08-05 DIAGNOSIS — D251 Intramural leiomyoma of uterus: Secondary | ICD-10-CM | POA: Insufficient documentation

## 2021-08-05 DIAGNOSIS — Z01812 Encounter for preprocedural laboratory examination: Secondary | ICD-10-CM | POA: Insufficient documentation

## 2021-08-05 DIAGNOSIS — Z20822 Contact with and (suspected) exposure to covid-19: Secondary | ICD-10-CM | POA: Insufficient documentation

## 2021-08-05 LAB — CBC
HCT: 29.5 % — ABNORMAL LOW (ref 36.0–46.0)
Hemoglobin: 8.6 g/dL — ABNORMAL LOW (ref 12.0–15.0)
MCH: 20.4 pg — ABNORMAL LOW (ref 26.0–34.0)
MCHC: 29.2 g/dL — ABNORMAL LOW (ref 30.0–36.0)
MCV: 70.1 fL — ABNORMAL LOW (ref 80.0–100.0)
Platelets: 360 10*3/uL (ref 150–400)
RBC: 4.21 MIL/uL (ref 3.87–5.11)
RDW: 19.2 % — ABNORMAL HIGH (ref 11.5–15.5)
WBC: 6.5 10*3/uL (ref 4.0–10.5)
nRBC: 0 % (ref 0.0–0.2)

## 2021-08-05 NOTE — Progress Notes (Signed)
Spoke with mary at dr Matthew Saras office and made aware hemoglobin 8.6 on lab work 08-05-2021, mary to make dr Matthew Saras aware, called dr Christella Hartigan mda and made aware hemoglobin 8.6 on labs todat t & s done no further action needed per dr Christella Hartigan mda

## 2021-08-06 LAB — SARS CORONAVIRUS 2 (TAT 6-24 HRS): SARS Coronavirus 2: NEGATIVE

## 2021-08-06 NOTE — Progress Notes (Signed)
Received call from Dr [facility] office today.  Dr Matthew Saras has ordered CBC to be repeated pre-op dos.

## 2021-08-09 ENCOUNTER — Encounter (HOSPITAL_BASED_OUTPATIENT_CLINIC_OR_DEPARTMENT_OTHER): Payer: Self-pay | Admitting: Obstetrics and Gynecology

## 2021-08-09 ENCOUNTER — Encounter (HOSPITAL_BASED_OUTPATIENT_CLINIC_OR_DEPARTMENT_OTHER)
Admission: RE | Disposition: A | Discharge status: Home / Self Care | Payer: Self-pay | Source: Home / Self Care | Attending: Obstetrics and Gynecology

## 2021-08-09 ENCOUNTER — Observation Stay (HOSPITAL_BASED_OUTPATIENT_CLINIC_OR_DEPARTMENT_OTHER)
Admission: RE | Admit: 2021-08-09 | Discharge: 2021-08-10 | Disposition: A | Discharge status: Home / Self Care | Payer: BC Managed Care – PPO | Attending: Obstetrics and Gynecology | Admitting: Obstetrics and Gynecology

## 2021-08-09 ENCOUNTER — Ambulatory Visit (HOSPITAL_BASED_OUTPATIENT_CLINIC_OR_DEPARTMENT_OTHER): Payer: BC Managed Care – PPO | Admitting: Certified Registered"

## 2021-08-09 DIAGNOSIS — D649 Anemia, unspecified: Secondary | ICD-10-CM | POA: Insufficient documentation

## 2021-08-09 DIAGNOSIS — N920 Excessive and frequent menstruation with regular cycle: Secondary | ICD-10-CM | POA: Diagnosis present

## 2021-08-09 DIAGNOSIS — D259 Leiomyoma of uterus, unspecified: Secondary | ICD-10-CM | POA: Insufficient documentation

## 2021-08-09 DIAGNOSIS — N838 Other noninflammatory disorders of ovary, fallopian tube and broad ligament: Secondary | ICD-10-CM | POA: Diagnosis not present

## 2021-08-09 DIAGNOSIS — N924 Excessive bleeding in the premenopausal period: Secondary | ICD-10-CM | POA: Diagnosis not present

## 2021-08-09 DIAGNOSIS — N72 Inflammatory disease of cervix uteri: Secondary | ICD-10-CM | POA: Insufficient documentation

## 2021-08-09 DIAGNOSIS — Z01818 Encounter for other preprocedural examination: Secondary | ICD-10-CM

## 2021-08-09 DIAGNOSIS — B1910 Unspecified viral hepatitis B without hepatic coma: Secondary | ICD-10-CM | POA: Diagnosis not present

## 2021-08-09 DIAGNOSIS — D630 Anemia in neoplastic disease: Secondary | ICD-10-CM | POA: Diagnosis not present

## 2021-08-09 DIAGNOSIS — N879 Dysplasia of cervix uteri, unspecified: Secondary | ICD-10-CM | POA: Diagnosis not present

## 2021-08-09 DIAGNOSIS — D500 Iron deficiency anemia secondary to blood loss (chronic): Secondary | ICD-10-CM | POA: Diagnosis not present

## 2021-08-09 DIAGNOSIS — D251 Intramural leiomyoma of uterus: Secondary | ICD-10-CM

## 2021-08-09 DIAGNOSIS — D250 Submucous leiomyoma of uterus: Secondary | ICD-10-CM

## 2021-08-09 HISTORY — DX: Benign neoplasm of connective and other soft tissue, unspecified: D21.9

## 2021-08-09 HISTORY — PX: LAPAROSCOPIC VAGINAL HYSTERECTOMY WITH SALPINGECTOMY: SHX6680

## 2021-08-09 HISTORY — DX: Unspecified ovarian cyst, unspecified side: N83.209

## 2021-08-09 LAB — CBC
HCT: 34.8 % — ABNORMAL LOW (ref 36.0–46.0)
HCT: 30.2 % — ABNORMAL LOW (ref 36.0–46.0)
Hemoglobin: 10 g/dL — ABNORMAL LOW (ref 12.0–15.0)
Hemoglobin: 8.7 g/dL — ABNORMAL LOW (ref 12.0–15.0)
MCH: 20.1 pg — ABNORMAL LOW (ref 26.0–34.0)
MCH: 19.9 pg — ABNORMAL LOW (ref 26.0–34.0)
MCHC: 28.7 g/dL — ABNORMAL LOW (ref 30.0–36.0)
MCHC: 28.8 g/dL — ABNORMAL LOW (ref 30.0–36.0)
MCV: 70 fL — ABNORMAL LOW (ref 80.0–100.0)
MCV: 68.9 fL — ABNORMAL LOW (ref 80.0–100.0)
Platelets: 374 10*3/uL (ref 150–400)
Platelets: 309 10*3/uL (ref 150–400)
RBC: 4.97 MIL/uL (ref 3.87–5.11)
RBC: 4.38 MIL/uL (ref 3.87–5.11)
RDW: 19.5 % — ABNORMAL HIGH (ref 11.5–15.5)
RDW: 18.9 % — ABNORMAL HIGH (ref 11.5–15.5)
WBC: 7.4 10*3/uL (ref 4.0–10.5)
WBC: 14.9 10*3/uL — ABNORMAL HIGH (ref 4.0–10.5)
nRBC: 0 % (ref 0.0–0.2)
nRBC: 0 % (ref 0.0–0.2)

## 2021-08-09 LAB — TYPE AND SCREEN
ABO/RH(D): A POS
Antibody Screen: NEGATIVE

## 2021-08-09 LAB — POCT PREGNANCY, URINE: Preg Test, Ur: NEGATIVE

## 2021-08-09 SURGERY — HYSTERECTOMY, VAGINAL, LAPAROSCOPY-ASSISTED, WITH SALPINGECTOMY
Anesthesia: General | Site: Abdomen | Laterality: Bilateral

## 2021-08-09 MED ORDER — POVIDONE-IODINE 10 % EX SWAB: 2.0000 "application " | Freq: Once | CUTANEOUS | Status: DC

## 2021-08-09 MED ORDER — GABAPENTIN 100 MG PO CAPS
ORAL_CAPSULE | ORAL | Status: AC
  Filled 2021-08-09: qty 2

## 2021-08-09 MED ORDER — HYDROCODONE-ACETAMINOPHEN 5-325 MG PO TABS
ORAL_TABLET | ORAL | Status: AC
  Filled 2021-08-09: qty 2

## 2021-08-09 MED ORDER — SODIUM CHLORIDE 0.9 % IV SOLN
INTRAVENOUS | Status: AC
  Filled 2021-08-09: qty 2

## 2021-08-09 MED ORDER — KETOROLAC TROMETHAMINE 30 MG/ML IJ SOLN
INTRAMUSCULAR | Status: DC | PRN
  Administered 2021-08-09: 30 mg via INTRAVENOUS

## 2021-08-09 MED ORDER — PROPOFOL 10 MG/ML IV BOLUS
INTRAVENOUS | Status: AC
Start: 2021-08-09 — End: ?
  Filled 2021-08-09: qty 20

## 2021-08-09 MED ORDER — ONDANSETRON HCL 4 MG/2ML IJ SOLN
INTRAMUSCULAR | Status: DC | PRN
  Administered 2021-08-09: 4 mg via INTRAVENOUS

## 2021-08-09 MED ORDER — GABAPENTIN 100 MG PO CAPS
200.0000 mg | ORAL_CAPSULE | Freq: Two times a day (BID) | ORAL | Status: DC
  Administered 2021-08-09 (×2): 200 mg via ORAL

## 2021-08-09 MED ORDER — KETOROLAC TROMETHAMINE 30 MG/ML IJ SOLN
INTRAMUSCULAR | Status: AC
  Filled 2021-08-09: qty 1

## 2021-08-09 MED ORDER — DEXTROSE IN LACTATED RINGERS 5 % IV SOLN: INTRAVENOUS | Status: DC

## 2021-08-09 MED ORDER — FENTANYL CITRATE (PF) 250 MCG/5ML IJ SOLN
INTRAMUSCULAR | Status: AC
  Filled 2021-08-09: qty 5

## 2021-08-09 MED ORDER — PHENYLEPHRINE 40 MCG/ML (10ML) SYRINGE FOR IV PUSH (FOR BLOOD PRESSURE SUPPORT)
PREFILLED_SYRINGE | INTRAVENOUS | Status: AC
  Filled 2021-08-09: qty 10

## 2021-08-09 MED ORDER — LIDOCAINE 2% (20 MG/ML) 5 ML SYRINGE
INTRAMUSCULAR | Status: DC | PRN
  Administered 2021-08-09: 1.5 mg/kg/h via INTRAVENOUS

## 2021-08-09 MED ORDER — PROPOFOL 10 MG/ML IV BOLUS
INTRAVENOUS | Status: AC
  Filled 2021-08-09: qty 20

## 2021-08-09 MED ORDER — ACETAMINOPHEN 500 MG PO TABS
1000.0000 mg | ORAL_TABLET | Freq: Once | ORAL | Status: AC
  Administered 2021-08-09: 1000 mg via ORAL

## 2021-08-09 MED ORDER — FENTANYL CITRATE (PF) 100 MCG/2ML IJ SOLN
INTRAMUSCULAR | Status: AC
  Filled 2021-08-09: qty 2

## 2021-08-09 MED ORDER — ONDANSETRON HCL 4 MG/2ML IJ SOLN
4.0000 mg | Freq: Four times a day (QID) | INTRAMUSCULAR | Status: DC | PRN
  Administered 2021-08-09: 4 mg via INTRAVENOUS

## 2021-08-09 MED ORDER — HYDROCODONE-ACETAMINOPHEN 5-325 MG PO TABS
1.0000 | ORAL_TABLET | ORAL | Status: DC | PRN
  Administered 2021-08-09 (×2): 2 via ORAL

## 2021-08-09 MED ORDER — LIDOCAINE 2% (20 MG/ML) 5 ML SYRINGE
INTRAMUSCULAR | Status: DC | PRN
  Administered 2021-08-09: 80 mg via INTRAVENOUS

## 2021-08-09 MED ORDER — FENTANYL CITRATE (PF) 100 MCG/2ML IJ SOLN
25.0000 ug | INTRAMUSCULAR | Status: DC | PRN
  Administered 2021-08-09 (×2): 25 ug via INTRAVENOUS

## 2021-08-09 MED ORDER — KETAMINE HCL 50 MG/5ML IJ SOSY
PREFILLED_SYRINGE | INTRAMUSCULAR | Status: AC
  Filled 2021-08-09: qty 5

## 2021-08-09 MED ORDER — ONDANSETRON HCL 4 MG PO TABS: 4.0000 mg | ORAL_TABLET | Freq: Four times a day (QID) | ORAL | Status: DC | PRN

## 2021-08-09 MED ORDER — DEXMEDETOMIDINE (PRECEDEX) IN NS 20 MCG/5ML (4 MCG/ML) IV SYRINGE
PREFILLED_SYRINGE | INTRAVENOUS | Status: AC
  Filled 2021-08-09: qty 10

## 2021-08-09 MED ORDER — BUPIVACAINE HCL (PF) 0.25 % IJ SOLN
INTRAMUSCULAR | Status: DC | PRN
Start: 2021-08-09 — End: 2021-08-09
  Administered 2021-08-09: 5 mL

## 2021-08-09 MED ORDER — FENTANYL CITRATE (PF) 100 MCG/2ML IJ SOLN
INTRAMUSCULAR | Status: DC | PRN
  Administered 2021-08-09: 100 ug via INTRAVENOUS
  Administered 2021-08-09 (×2): 50 ug via INTRAVENOUS

## 2021-08-09 MED ORDER — MENTHOL 3 MG MT LOZG: 1.0000 | LOZENGE | OROMUCOSAL | Status: DC | PRN

## 2021-08-09 MED ORDER — MIDAZOLAM HCL 2 MG/2ML IJ SOLN
INTRAMUSCULAR | Status: AC
  Filled 2021-08-09: qty 2

## 2021-08-09 MED ORDER — KETAMINE HCL 10 MG/ML IJ SOLN
INTRAMUSCULAR | Status: DC | PRN
  Administered 2021-08-09 (×3): 10 mg via INTRAVENOUS

## 2021-08-09 MED ORDER — SODIUM CHLORIDE 0.9 % IV SOLN
2.0000 g | INTRAVENOUS | Status: AC
  Administered 2021-08-09: 2 g via INTRAVENOUS

## 2021-08-09 MED ORDER — 0.9 % SODIUM CHLORIDE (POUR BTL) OPTIME
TOPICAL | Status: DC | PRN
  Administered 2021-08-09: 500 mL

## 2021-08-09 MED ORDER — LACTATED RINGERS IV SOLN: INTRAVENOUS | Status: DC

## 2021-08-09 MED ORDER — ONDANSETRON HCL 4 MG/2ML IJ SOLN
INTRAMUSCULAR | Status: AC
  Filled 2021-08-09: qty 2

## 2021-08-09 MED ORDER — PANTOPRAZOLE SODIUM 40 MG PO TBEC
DELAYED_RELEASE_TABLET | ORAL | Status: AC
  Filled 2021-08-09: qty 1

## 2021-08-09 MED ORDER — ACETAMINOPHEN 500 MG PO TABS
ORAL_TABLET | ORAL | Status: AC
  Filled 2021-08-09: qty 2

## 2021-08-09 MED ORDER — LIDOCAINE HCL (PF) 2 % IJ SOLN
INTRAMUSCULAR | Status: AC
  Filled 2021-08-09: qty 5

## 2021-08-09 MED ORDER — DEXAMETHASONE SODIUM PHOSPHATE 10 MG/ML IJ SOLN
INTRAMUSCULAR | Status: DC | PRN
  Administered 2021-08-09: 10 mg via INTRAVENOUS

## 2021-08-09 MED ORDER — SUGAMMADEX SODIUM 200 MG/2ML IV SOLN
INTRAVENOUS | Status: DC | PRN
  Administered 2021-08-09: 250 mg via INTRAVENOUS

## 2021-08-09 MED ORDER — KETOROLAC TROMETHAMINE 30 MG/ML IJ SOLN
30.0000 mg | Freq: Four times a day (QID) | INTRAMUSCULAR | Status: DC
  Administered 2021-08-09 (×2): 30 mg via INTRAVENOUS

## 2021-08-09 MED ORDER — LIDOCAINE HCL (PF) 2 % IJ SOLN
INTRAMUSCULAR | Status: AC
  Filled 2021-08-09: qty 10

## 2021-08-09 MED ORDER — SODIUM CHLORIDE 0.9 % IR SOLN
Status: DC | PRN
  Administered 2021-08-09: 1000 mL

## 2021-08-09 MED ORDER — LACTATED RINGERS IV SOLN: INTRAVENOUS | Status: DC | PRN

## 2021-08-09 MED ORDER — MIDAZOLAM HCL 5 MG/5ML IJ SOLN
INTRAMUSCULAR | Status: DC | PRN
  Administered 2021-08-09: 2 mg via INTRAVENOUS

## 2021-08-09 MED ORDER — MORPHINE SULFATE (PF) 4 MG/ML IV SOLN: 1.0000 mg | INTRAVENOUS | Status: DC | PRN

## 2021-08-09 MED ORDER — IBUPROFEN 200 MG PO TABS
600.0000 mg | ORAL_TABLET | Freq: Four times a day (QID) | ORAL | Status: DC
  Administered 2021-08-10 (×2): 600 mg via ORAL

## 2021-08-09 MED ORDER — ROCURONIUM BROMIDE 100 MG/10ML IV SOLN
INTRAVENOUS | Status: DC | PRN
  Administered 2021-08-09: 100 mg via INTRAVENOUS

## 2021-08-09 MED ORDER — PROPOFOL 10 MG/ML IV BOLUS
INTRAVENOUS | Status: DC | PRN
Start: 2021-08-09 — End: 2021-08-09
  Administered 2021-08-09: 150 mg via INTRAVENOUS

## 2021-08-09 MED ORDER — ROCURONIUM BROMIDE 10 MG/ML (PF) SYRINGE
PREFILLED_SYRINGE | INTRAVENOUS | Status: AC
  Filled 2021-08-09: qty 10

## 2021-08-09 MED ORDER — POLYETHYLENE GLYCOL 3350 17 G PO PACK: 17.0000 g | PACK | Freq: Every day | ORAL | Status: DC | PRN

## 2021-08-09 MED ORDER — PANTOPRAZOLE SODIUM 40 MG PO TBEC
40.0000 mg | DELAYED_RELEASE_TABLET | Freq: Every day | ORAL | Status: DC
  Administered 2021-08-09: 40 mg via ORAL

## 2021-08-09 SURGICAL SUPPLY — 52 items
ADH SKN CLS APL DERMABOND .7 (GAUZE/BANDAGES/DRESSINGS) ×1
CABLE HIGH FREQUENCY MONO STRZ (ELECTRODE) IMPLANT
CATH ROBINSON RED A/P 16FR (CATHETERS) ×3 IMPLANT
COVER BACK TABLE 60X90IN (DRAPES) ×3 IMPLANT
COVER MAYO STAND STRL (DRAPES) ×6 IMPLANT
COVER SURGICAL LIGHT HANDLE (MISCELLANEOUS) ×4 IMPLANT
DECANTER SPIKE VIAL GLASS SM (MISCELLANEOUS) IMPLANT
DERMABOND ADVANCED (GAUZE/BANDAGES/DRESSINGS) ×1
DERMABOND ADVANCED .7 DNX12 (GAUZE/BANDAGES/DRESSINGS) ×2 IMPLANT
DRSG OPSITE POSTOP 3X4 (GAUZE/BANDAGES/DRESSINGS) IMPLANT
DURAPREP 26ML APPLICATOR (WOUND CARE) ×3 IMPLANT
ELECT REM PT RETURN 9FT ADLT (ELECTROSURGICAL) ×2
ELECTRODE REM PT RTRN 9FT ADLT (ELECTROSURGICAL) ×2 IMPLANT
GAUZE 4X4 16PLY ~~LOC~~+RFID DBL (SPONGE) ×8 IMPLANT
GLOVE SURG ENC MOIS LTX SZ7 (GLOVE) ×6 IMPLANT
GLOVE SURG LTX SZ6.5 (GLOVE) ×3 IMPLANT
GLOVE SURG PR MICRO ENCORE 7.5 (GLOVE) ×1 IMPLANT
GLOVE SURG UNDER POLY LF SZ6.5 (GLOVE) ×1 IMPLANT
GLOVE SURG UNDER POLY LF SZ7 (GLOVE) ×1 IMPLANT
GLOVE SURG UNDER POLY LF SZ7.5 (GLOVE) ×2 IMPLANT
GOWN STRL REUS W/ TWL LRG LVL3 (GOWN DISPOSABLE) IMPLANT
GOWN STRL REUS W/TWL LRG LVL3 (GOWN DISPOSABLE) ×2
HOLDER FOLEY CATH W/STRAP (MISCELLANEOUS) ×3 IMPLANT
IV NS 1000ML (IV SOLUTION) ×2
IV NS 1000ML BAXH (IV SOLUTION) IMPLANT
KIT TURNOVER CYSTO (KITS) ×3 IMPLANT
LIGASURE IMPACT 36 18CM CVD LR (INSTRUMENTS) ×1 IMPLANT
NEEDLE INSUFFLATION 120MM (ENDOMECHANICALS) ×3 IMPLANT
NS IRRIG 1000ML POUR BTL (IV SOLUTION) ×2 IMPLANT
NS IRRIG 500ML POUR BTL (IV SOLUTION) ×1 IMPLANT
PACK LAVH (CUSTOM PROCEDURE TRAY) ×3 IMPLANT
PACK ROBOTIC GOWN (GOWN DISPOSABLE) ×3 IMPLANT
PACK TRENDGUARD 450 HYBRID PRO (MISCELLANEOUS) ×2 IMPLANT
PROTECTOR NERVE ULNAR (MISCELLANEOUS) ×6 IMPLANT
SEALER TISSUE G2 CVD JAW 45CM (ENDOMECHANICALS) ×1 IMPLANT
SET SUCTION IRRIG HYDROSURG (IRRIGATION / IRRIGATOR) ×1 IMPLANT
SET TUBE SMOKE EVAC HIGH FLOW (TUBING) ×3 IMPLANT
SPONGE T-LAP 4X18 ~~LOC~~+RFID (SPONGE) ×3 IMPLANT
SUT MON AB 2-0 CT1 36 (SUTURE) IMPLANT
SUT VIC AB 0 CT1 18XCR BRD8 (SUTURE) ×4 IMPLANT
SUT VIC AB 0 CT1 36 (SUTURE) ×3 IMPLANT
SUT VIC AB 0 CT1 8-18 (SUTURE) ×4
SUT VIC AB 2-0 CT1 (SUTURE) IMPLANT
SUT VIC AB 4-0 PS2 18 (SUTURE) ×3 IMPLANT
SUT VICRYL 0 TIES 12 18 (SUTURE) ×3 IMPLANT
TOWEL OR 17X26 10 PK STRL BLUE (TOWEL DISPOSABLE) ×3 IMPLANT
TRAY FOLEY W/BAG SLVR 14FR LF (SET/KITS/TRAYS/PACK) ×3 IMPLANT
TRENDGUARD 450 HYBRID PRO PACK (MISCELLANEOUS) ×2
TROCAR BLADELESS OPT 5 100 (ENDOMECHANICALS) ×1 IMPLANT
TROCAR OPTI TIP 5M 100M (ENDOMECHANICALS) ×3 IMPLANT
TROCAR XCEL DIL TIP R 11M (ENDOMECHANICALS) ×3 IMPLANT
WARMER LAPAROSCOPE (MISCELLANEOUS) ×3 IMPLANT

## 2021-08-09 NOTE — Progress Notes (Signed)
The patient was re-examined with no change in status Hgb 8.6, discussed poss need to transfuse>>pt acceptant

## 2021-08-09 NOTE — Transfer of Care (Signed)
Immediate Anesthesia Transfer of Care Note  Patient: Kristy Johnson  Procedure(s) Performed: LAPAROSCOPIC ASSISTED VAGINAL HYSTERECTOMY WITH left portion SALPINGECTOMY (Bilateral: Abdomen)  Patient Location: PACU  Anesthesia Type:General  Level of Consciousness: drowsy  Airway & Oxygen Therapy: Patient Spontanous Breathing and Patient connected to face mask oxygen  Post-op Assessment: Report given to RN and Post -op Vital signs reviewed and stable  Post vital signs: Reviewed and stable  Last Vitals:  Vitals Value Taken Time  BP 132/80 08/09/21 0930  Temp 36.4 C 08/09/21 0930  Pulse 78 08/09/21 0931  Resp 21 08/09/21 0931  SpO2 100 % 08/09/21 0931  Vitals shown include unvalidated device data.  Last Pain:  Vitals:   08/09/21 0643  TempSrc: Oral  PainSc: 0-No pain      Patients Stated Pain Goal: 5 (28/76/81 1572)  Complications: No notable events documented.

## 2021-08-09 NOTE — Anesthesia Postprocedure Evaluation (Signed)
Anesthesia Post Note  Patient: Kristy Johnson  Procedure(s) Performed: LAPAROSCOPIC ASSISTED VAGINAL HYSTERECTOMY WITH left portion SALPINGECTOMY (Bilateral: Abdomen)     Patient location during evaluation: PACU Anesthesia Type: General Level of consciousness: awake and alert Pain management: pain level controlled Vital Signs Assessment: post-procedure vital signs reviewed and stable Respiratory status: spontaneous breathing, nonlabored ventilation, respiratory function stable and patient connected to nasal cannula oxygen Cardiovascular status: blood pressure returned to baseline and stable Postop Assessment: no apparent nausea or vomiting Anesthetic complications: no   No notable events documented.  Last Vitals:  Vitals:   08/09/21 1100 08/09/21 1115  BP: 128/74 123/78  Pulse: 71 72  Resp: 12 (!) 9  Temp:    SpO2: 100% 100%    Last Pain:  Vitals:   08/09/21 0945  TempSrc:   PainSc: 7                  Willella Harding L Buell Parcel

## 2021-08-09 NOTE — Anesthesia Procedure Notes (Signed)
Procedure Name: Intubation Date/Time: 08/09/2021 7:36 AM Performed by: Gwyndolyn Saxon, CRNA Pre-anesthesia Checklist: Patient identified, Emergency Drugs available, Suction available and Patient being monitored Patient Re-evaluated:Patient Re-evaluated prior to induction Oxygen Delivery Method: Circle system utilized Preoxygenation: Pre-oxygenation with 100% oxygen Induction Type: IV induction Ventilation: Mask ventilation without difficulty Laryngoscope Size: Miller and 2 Grade View: Grade II Tube type: Oral Tube size: 7.0 mm Number of attempts: 1 Airway Equipment and Method: Patient positioned with wedge pillow and Stylet Placement Confirmation: ETT inserted through vocal cords under direct vision, positive ETCO2 and breath sounds checked- equal and bilateral Secured at: 21 cm Tube secured with: Tape Dental Injury: Teeth and Oropharynx as per pre-operative assessment

## 2021-08-09 NOTE — Anesthesia Preprocedure Evaluation (Addendum)
Anesthesia Evaluation  Patient identified by MRN, date of birth, ID band Patient awake    Reviewed: Allergy & Precautions, NPO status , Patient's Chart, lab work & pertinent test results  Airway Mallampati: III  TM Distance: >3 FB Neck ROM: Full    Dental no notable dental hx. (+) Teeth Intact, Dental Advisory Given   Pulmonary neg pulmonary ROS,    Pulmonary exam normal breath sounds clear to auscultation       Cardiovascular negative cardio ROS Normal cardiovascular exam Rhythm:Regular Rate:Normal     Neuro/Psych  Headaches, negative psych ROS   GI/Hepatic negative GI ROS, (+) Hepatitis -, B  Endo/Other  negative endocrine ROS  Renal/GU negative Renal ROS  negative genitourinary   Musculoskeletal negative musculoskeletal ROS (+)   Abdominal   Peds  Hematology  (+) Blood dyscrasia, anemia , Lab Results      Component                Value               Date                      WBC                      6.5                 08/05/2021                HGB                      8.6 (L)             08/05/2021                HCT                      29.5 (L)            08/05/2021                MCV                      70.1 (L)            08/05/2021                PLT                      360                 08/05/2021              Anesthesia Other Findings   Reproductive/Obstetrics                            Anesthesia Physical Anesthesia Plan  ASA: 3  Anesthesia Plan: General   Post-op Pain Management: Tylenol PO (pre-op)*, Ketamine IV*, Lidocaine infusion* and Toradol IV (intra-op)*   Induction: Intravenous  PONV Risk Score and Plan: 3 and Midazolam, Dexamethasone and Ondansetron  Airway Management Planned: Oral ETT  Additional Equipment:   Intra-op Plan:   Post-operative Plan: Extubation in OR  Informed Consent: I have reviewed the patients History and Physical, chart, labs and  discussed the procedure including the risks, benefits and alternatives for the proposed anesthesia with the patient or authorized representative who has indicated  his/her understanding and acceptance.     Dental advisory given  Plan Discussed with: CRNA  Anesthesia Plan Comments: (2 IVs)       Anesthesia Quick Evaluation

## 2021-08-09 NOTE — Op Note (Signed)
Preop diagnosis: Menorrhagia, leiomyoma with anemia  Postoperative diagnosis: Same  Procedure: Laparoscopy, LAVH, bilateral salpingectomy  Surgeon: Matthew Saras  Assistant: Mardelle Matte  EBL: 400 cc  Procedure findings:  The patient taken to the operating room after adequate level of general anesthesia was obtained with the patient's legs in stirrups the abdomen perineum and vagina were prepped and draped, the bladder was drained with in and out catheter.  This was done after appropriate timeouts were taken.  From below, the uterus was 8 weeks size under exam, mobile, Hulka tenaculum was position.  Attention directed to the abdomen, the subumbilical area was infiltrated with quarter percent Marcaine plain a small incision was made and the varies needle was introduced without difficulty.  After a 3 L pneumoperitoneum was then created, laparoscopic trocar and sleeve were then introduced that difficulty.  Prior to this the intra-abdominal position of the varies needle was verified by pressure water testing.  1011 trocar was inserted, there was no evidence of any bleeding or trauma, 3 to 4 fingerbreadths above the symphysis in the midline a 5 mm trocar was inserted under direct visualization.  The patient then placed in Trendelenburg and the pelvic findings as follows:  Uterus itself was symmetrically enlarged 10 weeks size there was a Filshie clip noted on each tube of the adnexa cul-de-sac otherwise unremarkable.  An atraumatic grasping forcep was then used to place the right tube on traction and the mesosalpinx was then coagulated and divided with the Enseal device, similarly the suspensory ligament of the ovary down to and including the round ligament on the right side was dissected free with the Enseal this area was hemostatic.  The exact same was repeated on the opposite side, thus both ovaries were conserved.  Once this was completed the vaginal portion of the procedure started.  Patient's legs were  extended, weighted speculum was positioned, cervix grasped with tenaculum cervical vaginal mucosa was incised circumferentially, posterior cordotomy performed difficulty, the vaginal mucosa advanced anteriorly until the anterior peritoneal reflection Was identified, this was entered sharply, retractor then used to elevate the bladder out of the field.  In sequential manner staying close to the uterus the LigaSure device was then used to coagulate and divide the uterosacral ligament, cardinal ligament, uterine artery artery pedicle and upper broad ligament pedicles.  Once this was completed, 1 pedunculated posterior fibroid was removed separately and the posterior wall of the uterus was morcellated in the midline to allow delivery of the fundus posteriorly remaining pedicles were clamped divided and free tied with 0 Vicryl suture.  Once this was completed the vaginal cuff was then closed with a running locked 2-0 Vicryl suture from 3 to 9:00 on either side.  Prior to closure sponge, needle, instrument counts reported as correct x2.  Vaginal mucosa then closed right to left with interrupted 2-0 Vicryl sutures.  Foley catheter was position draining clear urine.  Repeat laparoscopy at this point, nasal irrigation used to irrigate the surgical site, pressure was reduced and irrigant was aspirated and the site was observed operative site noted to be hemostatic.  Prior to closure sponge, needle, instrument counts reported as correct x2 the umbilical incision closed after instruments were removed and gas was allowed escape with 4-0 Monocryl subcuticular closure and Dermabond on the lower.  She tolerated this well went to recovery room in good condition.  Clear urine noted at the end of the case.  Dictated with [employer]  Molli Posey MD

## 2021-08-10 ENCOUNTER — Encounter (HOSPITAL_BASED_OUTPATIENT_CLINIC_OR_DEPARTMENT_OTHER): Payer: Self-pay | Admitting: Obstetrics and Gynecology

## 2021-08-10 DIAGNOSIS — D649 Anemia, unspecified: Secondary | ICD-10-CM | POA: Diagnosis not present

## 2021-08-10 DIAGNOSIS — D259 Leiomyoma of uterus, unspecified: Secondary | ICD-10-CM | POA: Diagnosis not present

## 2021-08-10 DIAGNOSIS — N72 Inflammatory disease of cervix uteri: Secondary | ICD-10-CM | POA: Diagnosis not present

## 2021-08-10 DIAGNOSIS — N924 Excessive bleeding in the premenopausal period: Secondary | ICD-10-CM | POA: Diagnosis not present

## 2021-08-10 LAB — CBC
HCT: 27.4 % — ABNORMAL LOW (ref 36.0–46.0)
Hemoglobin: 8.1 g/dL — ABNORMAL LOW (ref 12.0–15.0)
MCH: 20.4 pg — ABNORMAL LOW (ref 26.0–34.0)
MCHC: 29.6 g/dL — ABNORMAL LOW (ref 30.0–36.0)
MCV: 68.8 fL — ABNORMAL LOW (ref 80.0–100.0)
Platelets: 384 10*3/uL (ref 150–400)
RBC: 3.98 MIL/uL (ref 3.87–5.11)
RDW: 18.8 % — ABNORMAL HIGH (ref 11.5–15.5)
WBC: 13.4 10*3/uL — ABNORMAL HIGH (ref 4.0–10.5)
nRBC: 0 % (ref 0.0–0.2)

## 2021-08-10 LAB — SURGICAL PATHOLOGY

## 2021-08-10 MED ORDER — IBUPROFEN 200 MG PO TABS
ORAL_TABLET | ORAL | Status: AC
  Filled 2021-08-10: qty 3

## 2021-08-10 MED ORDER — IBUPROFEN 600 MG PO TABS: 600.0000 mg | ORAL_TABLET | Freq: Four times a day (QID) | ORAL | 0 refills | Status: DC

## 2021-08-10 MED ORDER — FERROUS SULFATE 90 (18 FE) MG PO TABS: 1.0000 | ORAL_TABLET | Freq: Every day | ORAL | 3 refills | Status: DC

## 2021-08-10 MED ORDER — HYDROCODONE-ACETAMINOPHEN 5-325 MG PO TABS: 1.0000 | ORAL_TABLET | ORAL | 0 refills | Status: DC | PRN

## 2021-08-10 MED ORDER — HYDROCODONE-ACETAMINOPHEN 5-325 MG PO TABS
ORAL_TABLET | ORAL | Status: AC
  Filled 2021-08-10: qty 1

## 2021-08-10 MED ORDER — HYDROCODONE-ACETAMINOPHEN 5-325 MG PO TABS
ORAL_TABLET | ORAL | Status: AC
  Filled 2021-08-10: qty 2

## 2021-08-10 NOTE — Discharge Summary (Signed)
Physician Discharge Summary  Patient ID: Kristy Johnson MRN: [id number] DOB/AGE: Sep 12, 1975 46 y.o.  Admit date: 08/09/2021 Discharge date: 08/10/2021  Admission Diagnoses:Menorrhagia, leiomyoma, anemia  Discharge Diagnoses: same Principal Problem:   Menorrhagia   Discharged Condition: good  Hospital Course: adm for LAVH/BS for anemia and fibroids, adm overnight , on POD 1 was afeb, tol PO , ambulating and ready for D/C  Consults: None  Significant Diagnostic Studies: labs:  Results for orders placed or performed during the hospital encounter of 08/09/21 (from the past 24 hour(s))  CBC     Status: Abnormal   Collection Time: 08/09/21  1:50 PM  Result Value Ref Range   WBC 14.9 (H) 4.0 - 10.5 K/uL   RBC 4.38 3.87 - 5.11 MIL/uL   Hemoglobin 8.7 (L) 12.0 - 15.0 g/dL   HCT 30.2 (L) 36.0 - 46.0 %   MCV 68.9 (L) 80.0 - 100.0 fL   MCH 19.9 (L) 26.0 - 34.0 pg   MCHC 28.8 (L) 30.0 - 36.0 g/dL   RDW 18.9 (H) 11.5 - 15.5 %   Platelets 309 150 - 400 K/uL   nRBC 0.0 0.0 - 0.2 %  CBC     Status: Abnormal   Collection Time: 08/10/21 12:23 AM  Result Value Ref Range   WBC 13.4 (H) 4.0 - 10.5 K/uL   RBC 3.98 3.87 - 5.11 MIL/uL   Hemoglobin 8.1 (L) 12.0 - 15.0 g/dL   HCT 27.4 (L) 36.0 - 46.0 %   MCV 68.8 (L) 80.0 - 100.0 fL   MCH 20.4 (L) 26.0 - 34.0 pg   MCHC 29.6 (L) 30.0 - 36.0 g/dL   RDW 18.8 (H) 11.5 - 15.5 %   Platelets 384 150 - 400 K/uL   nRBC 0.0 0.0 - 0.2 %     Treatments: surgery: LAVH/BS  Discharge Exam: Blood pressure 102/71, pulse 64, temperature 98.4 F (36.9 C), resp. rate 16, height 5\' 4"  (1.626 m), weight 117.8 kg, last menstrual period 07/20/2021, SpO2 100 %, unknown if currently breastfeeding. General appearance: alert Abd + BS, nontender, incs C/D Disposition: Discharge disposition: 01-Home or Self Care         Follow-up Information     Molli Posey, MD. Schedule an appointment as soon as possible for a visit in 1 week(s).   Specialty:  Obstetrics and Gynecology Contact information: [address] [place] [address] [phone]                 Signed: Margarette Asal 08/10/2021, 7:27 AM

## 2021-08-16 DIAGNOSIS — D649 Anemia, unspecified: Secondary | ICD-10-CM | POA: Diagnosis not present

## 2021-09-08 ENCOUNTER — Ambulatory Visit: Payer: BC Managed Care – PPO | Admitting: Student

## 2021-09-08 DIAGNOSIS — D649 Anemia, unspecified: Secondary | ICD-10-CM | POA: Diagnosis not present

## 2021-09-08 NOTE — Progress Notes (Signed)
? ? ?  SUBJECTIVE:  ? ?CHIEF COMPLAINT / HPI: left sided back pain  ? ?Patient reports left sided back pain that has been present for four days. She first had pain beneath her left breast that wrapped around to the left side of her back while picking up a box. She states that the pain has since improved tremendously. She has had her daughter massage her back and has taken tylenol. She denies any chest pain, dyspnea or nausea associated with the pain. Of note, she recently had a hysterectomy. She denies paresthesias or weakness. She denies neck pain. Patient reports she can move her left arm and bend like normal.  ? ? ?PERTINENT  PMH / PSH:  ?Neck sprain/strain ?Plantar fascitis  ?Thoracic strain  ? ?OBJECTIVE:  ? ?BP 120/64   Pulse 62   Ht '5\' 4"'$  (1.626 m)   Wt 256 lb 9.6 oz (116.4 kg)   LMP 07/20/2021   SpO2 98%   BMI 44.05 kg/m?   ?Back and arms  ?No gross deformity, scoliosis. ?TTP medial scapula on left.  No midline or bony TTP. ?FROM of bilateral upper extremities  ?Strength LEs 5/5 all muscle groups.   ?Sensation intact to light touch bilaterally. ?No skin changes, bruising, erythema or edema  ? ?ASSESSMENT/PLAN:  ? ?Spasm of thoracic back muscle ?Improved muscle pain with tylenol, stretches and massage  ?No red flag signs or symptoms  ?Will have patient use NSAIDS and heat therapy  ?Handout given for stretches and exercises  ?RTC precautions discussed, patient voiced understanding  ?  ? ? ?Eulis Foster, MD ?[place]  ?

## 2021-09-09 ENCOUNTER — Ambulatory Visit (INDEPENDENT_AMBULATORY_CARE_PROVIDER_SITE_OTHER): Payer: BC Managed Care – PPO | Admitting: Family Medicine

## 2021-09-09 VITALS — BP 120/64 | HR 62 | Ht 64.0 in | Wt 256.6 lb

## 2021-09-09 DIAGNOSIS — M62830 Muscle spasm of back: Secondary | ICD-10-CM | POA: Insufficient documentation

## 2021-09-09 MED ORDER — FLUTICASONE PROPIONATE 50 MCG/ACT NA SUSP
2.0000 | Freq: Every day | NASAL | 6 refills | Status: DC
Start: 2021-09-09 — End: 2022-09-26

## 2021-09-09 MED ORDER — FEXOFENADINE HCL 60 MG PO TABS: 60.0000 mg | ORAL_TABLET | Freq: Every day | ORAL | 0 refills | Status: DC

## 2021-09-09 NOTE — Patient Instructions (Signed)
I have prescribed a nasal spray and allergy medication for you to take daily.  ? ?For your back pain, please take 200-'400mg'$  of ibuprofen (examples Aleeve or Advil) every 6-8 hours to help with the strained muscle.  ? ?I also recommend applying a heating pad to the area for 20 minute intervals to help with the soreness.  ? ?I am happy to hear you are feeling better. Please return to care if the pain severity increases or your begin to have trouble breathing or moving that left arm.  ? ?Thoracic Strain Rehab ?Ask your health care provider which exercises are safe for you. Do exercises exactly as told by your health care provider and adjust them as directed. It is normal to feel mild stretching, pulling, tightness, or discomfort as you do these exercises. Stop right away if you feel sudden pain or your pain gets worse. Do not begin these exercises until told by your health care provider ?Marland Kitchen ?Stretching and range-of-motion exercise ?This exercise warms up your muscles and joints and improves the movement and flexibility of your back and shoulders. This exercise also helps to relieve pain. ?Chest and spine stretch ? ?Lie down on your back on a firm surface. ?Roll a towel or a small blanket so it is about 4 inches (10 cm) in diameter. ?Put the towel lengthwise under the middle of your back so it is under your spine, but not under your shoulder blades. ?Put your hands behind your head and let your elbows fall to your sides. This will increase your stretch. ?Take a deep breath (inhale). ?Hold for __________ seconds. ?Relax after you breathe out (exhale). ?Repeat __________ times. Complete this exercise __________ times a day. ?Strengthening exercises ?These exercises build strength and endurance in your back and your shoulder blade muscles. Endurance is the ability to use your muscles for a long time, even after they get tired. ?Alternating arm and leg raises ? ?Get on your hands and knees on a firm surface. If you are on a  hard floor, you may want to use padding, such as an exercise mat, to cushion your knees. ?Line up your arms and legs. Your hands should be directly below your shoulders, and your knees should be directly below your hips. ?Lift your left leg behind you. At the same time, raise your right arm and straighten it in front of you. ?Do not lift your leg higher than your hip. ?Do not lift your arm higher than your shoulder. ?Keep your abdominal and back muscles tight. ?Keep your hips facing the ground. ?Do not arch your back. ?Keep your balance carefully, and do not hold your breath. ?Hold for __________ seconds. ?Slowly return to the starting position and repeat with your right leg and your left arm. ?Repeat __________ times. Complete this exercise __________ times a day. ?Straight arm rows ?This exercise is also called shoulder extension exercise. ?Stand with your feet shoulder width apart. ?Secure an exercise band to a stable object in front of you so the band is at or above shoulder height. ?Hold one end of the exercise band in each hand. ?Straighten your elbows and lift your hands up to shoulder height. ?Step back, away from the secured end of the exercise band, until the band stretches. ?Squeeze your shoulder blades together and pull your hands down to the sides of your thighs. Stop when your hands are straight down by your sides. This is shoulder extension. Do not let your hands go behind your body. ?Hold for __________  seconds. ?Slowly return to the starting position. ?Repeat __________ times. Complete this exercise __________ times a day. ?Prone shoulder external rotation ?Lie on your abdomen on a firm bed so your left / right forearm hangs over the edge of the bed and your upper arm is on the bed, straight out from your body. This is the prone position. ?Your elbow should be bent. ?Your palm should be facing your feet. ?If instructed, hold a __________ weight in your hand. ?Squeeze your shoulder blade toward the  middle of your back. Do not let your shoulder lift toward your ear. ?Keep your elbow bent in a 90-degree angle (right angle) while you slowly move your forearm up toward the ceiling. Move your forearm up to the height of the bed, toward your head. This is external rotation. ?Your upper arm should not move. ?At the top of the movement, your palm should face the floor. ?Hold for __________ seconds. ?Slowly return to the starting position and relax your muscles. ?Repeat __________ times. Complete this exercise __________ times a day. ?Rowing scapular retraction ?This is an exercise in which the shoulder blades (scapulae) are pulled toward each other (retraction). ?Sit in a stable chair without armrests, or stand up. ?Secure an exercise band to a stable object in front of you so the band is at shoulder height. ?Hold one end of the exercise band in each hand. Your palms should face down. ?Bring your arms out straight in front of you. ?Step back, away from the secured end of the exercise band, until the band stretches. ?Pull the band backward. As you do this, bend your elbows and squeeze your shoulder blades together, but avoid letting the rest of your body move. Do not shrug your shoulders upward while you do this. ?Stop when your elbows are at your sides or slightly behind your body. ?Hold for __________ seconds. ?Slowly straighten your arms to return to the starting position. ?Repeat __________ times. Complete this exercise __________ times a day. ?Posture and body mechanics ?Good posture and healthy body mechanics can help to relieve stress in your body's tissues and joints. Body mechanics refers to the movements and positions of your body while you do your daily activities. Posture is part of body mechanics. Good posture means: ?Your spine is in its natural S-curve position (neutral). ?Your shoulders are pulled back slightly. ?Your head is not tipped forward. ?Follow these guidelines to improve your posture and body  mechanics in your everyday activities. ?Standing ? ?When standing, keep your spine neutral and your feet about hip width apart. Keep a slight bend in your knees. Your ears, shoulders, and hips should line up with each other. ?When you do a task in which you lean forward while standing in one place for a long time, place one foot up on a stable object that is 2-4 inches (5-10 cm) high, such as a footstool. This helps keep your spine neutral. ?Sitting ? ?When sitting, keep your spine neutral and keep your feet flat on the floor. Use a footrest, if necessary, and keep your thighs parallel to the floor. Avoid rounding your shoulders, and avoid tilting your head forward. ?When working at a desk or a computer, keep your desk at a height where your hands are slightly lower than your elbows. Slide your chair under your desk so you are close enough to maintain good posture. ?When working at a computer, place your monitor at a height where you are looking straight ahead and you do not have to  tilt your head forward or downward to look at the screen. ?Resting ?When lying down and resting, avoid positions that are most painful for you. ?If you have pain with activities such as sitting, bending, stooping, or squatting (flexion-basedactivities), lie in a position in which your body does not bend very much. For example, avoid curling up on your side with your arms and knees near your chest (fetal position). ?If you have pain with activities such as standing for a long time or reaching with your arms (extension-basedactivities), lie with your spine in a neutral position and bend your knees slightly. Try the following positions: ?Lie on your side with a pillow between your knees. ?Lie on your back with a pillow under your knees. ? ?Lifting ? ?When lifting objects, keep your feet at least shoulder width apart and tighten your abdominal muscles. ?Bend your knees and hips and keep your spine neutral. It is important to lift using the  strength of your legs, not your back. Do not lock your knees straight out. ?Always ask for help to lift heavy or awkward objects. ?This information is not intended to replace advice given to you by your

## 2021-09-09 NOTE — Assessment & Plan Note (Signed)
Improved muscle pain with tylenol, stretches and massage  ?No red flag signs or symptoms  ?Will have patient use NSAIDS and heat therapy  ?Handout given for stretches and exercises  ?RTC precautions discussed, patient voiced understanding  ?

## 2021-11-16 ENCOUNTER — Encounter: Payer: Self-pay | Admitting: *Deleted

## 2022-03-24 DIAGNOSIS — N951 Menopausal and female climacteric states: Secondary | ICD-10-CM | POA: Diagnosis not present

## 2022-03-24 DIAGNOSIS — D649 Anemia, unspecified: Secondary | ICD-10-CM | POA: Diagnosis not present

## 2022-05-30 DIAGNOSIS — D649 Anemia, unspecified: Secondary | ICD-10-CM | POA: Diagnosis not present

## 2022-07-07 ENCOUNTER — Other Ambulatory Visit: Payer: Self-pay | Admitting: Family

## 2022-07-07 DIAGNOSIS — N644 Mastodynia: Secondary | ICD-10-CM

## 2022-07-29 ENCOUNTER — Ambulatory Visit: Payer: BC Managed Care – PPO

## 2022-07-29 ENCOUNTER — Ambulatory Visit
Admission: RE | Admit: 2022-07-29 | Discharge: 2022-07-29 | Disposition: A | Discharge status: Home / Self Care | Payer: BC Managed Care – PPO | Source: Ambulatory Visit | Attending: Family | Admitting: Family

## 2022-07-29 DIAGNOSIS — R922 Inconclusive mammogram: Secondary | ICD-10-CM | POA: Diagnosis not present

## 2022-07-29 DIAGNOSIS — N644 Mastodynia: Secondary | ICD-10-CM

## 2022-09-26 ENCOUNTER — Encounter: Payer: Self-pay | Admitting: Student

## 2022-09-26 ENCOUNTER — Ambulatory Visit (INDEPENDENT_AMBULATORY_CARE_PROVIDER_SITE_OTHER): Payer: BC Managed Care – PPO | Admitting: Student

## 2022-09-26 VITALS — BP 106/68 | HR 92 | Ht 64.0 in | Wt 264.8 lb

## 2022-09-26 DIAGNOSIS — D500 Iron deficiency anemia secondary to blood loss (chronic): Secondary | ICD-10-CM

## 2022-09-26 DIAGNOSIS — M25569 Pain in unspecified knee: Secondary | ICD-10-CM | POA: Insufficient documentation

## 2022-09-26 DIAGNOSIS — M25561 Pain in right knee: Secondary | ICD-10-CM | POA: Diagnosis not present

## 2022-09-26 MED ORDER — FLUTICASONE PROPIONATE 50 MCG/ACT NA SUSP: 2.0000 | Freq: Every day | NASAL | 6 refills | Status: DC

## 2022-09-26 NOTE — Patient Instructions (Addendum)
It was great to see you today! Thank you for choosing [facility] for your primary care. Kristy Johnson was seen for right knee pain.  Today we addressed: I am concerned for a Baker's cyst.  We have you scheduled at 4/19 with the sports medicine office they will call you if they are able to do it sooner unfortunately other doctors are out of town currently.  If this is truly a Baker's cyst they will be able to drain it and give you some relief.  In the meantime I would transition between Tylenol and ibuprofen for pain relief. Anemia: I am still checking labs today to see if you are iron deficient or anemic.  If you haven't already, sign up for My Chart to have easy access to your labs results, and communication with your primary care physician.  We are checking some labs today. If they are abnormal, I will call you. If they are normal, I will send you a MyChart message (if it is active) or a letter in the mail. If you do not hear about your labs in the next 2 weeks, please call the office.  You should return to our clinic Return if symptoms worsen or fail to improve. Please arrive 15 minutes before your appointment to ensure smooth check in process.  We appreciate your efforts in making this happen.  Thank you for allowing me to participate in your care, Shelby Mattocks, DO 09/26/2022, 3:14 PM PGY-2, [facility]

## 2022-09-26 NOTE — Assessment & Plan Note (Addendum)
Leg cramp transitioning to pain that is present frequently and worse when climbing stairs gives me concern for muscular etiology.  Low suspicion for DVT.  Presentation concerning for Baker's cyst, follow-up with sports medicine on 4/19, sooner if they are able.

## 2022-09-26 NOTE — Progress Notes (Signed)
  SUBJECTIVE:   CHIEF COMPLAINT / HPI:   Leg cramp: Wednesday morning she began having right leg cramp, further endorses bilateral ankle swelling. 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 Endorses cramp sensation improved but feels like the muscles of her upper right leg are in pain (8/10). Pain is consistent all the time, worse when climbing stairs. Notes she has had leg cramps in the past but not this serious. Only tried ibuprofen 400mg  which did not help. Denies fever, constipation.   PERTINENT  PMH / PSH: Obesity, iron deficiency anemia  Patient Care Team: Shelby Mattocks, DO as PCP - General (Family Medicine) OBJECTIVE:  BP 106/68   Pulse 92   Ht 5\' 4"  (1.626 m)   Wt 264 lb 12.8 oz (120.1 kg)   LMP 07/20/2021   SpO2 97%   BMI 45.45 kg/m  General: Uncomfortable appearing, NAD MSK: Lower leg circumferences measuring only 1 cm different (47cm L vs 48cm R), 4/5 strength of right knee flexion, sensation intact bilaterally, 7 to 8 cm right posterior knee swelling concerning for Baker's cyst; no pitting edema bilaterally    ASSESSMENT/PLAN:  Acute pain of right knee Assessment & Plan: Leg cramp transitioning to pain that is present frequently and worse when climbing stairs gives me concern for muscular etiology.  Low suspicion for DVT.  Presentation concerning for Baker's cyst, follow-up with sports medicine on 4/19, sooner if they are able.   Iron deficiency anemia due to chronic blood loss -     CBC -     Ferritin  Other orders -     Fluticasone Propionate; Place 2 sprays into both nostrils daily.  Dispense: 16 g; Refill: 6  Return if symptoms worsen or fail to improve. Shelby Mattocks, DO 09/26/2022, 4:39 PM PGY-2, [facility]

## 2022-09-27 LAB — CBC
Hematocrit: 36.1 % (ref 34.0–46.6)
Hemoglobin: 11.4 g/dL (ref 11.1–15.9)
MCH: 26.5 pg — ABNORMAL LOW (ref 26.6–33.0)
MCHC: 31.6 g/dL (ref 31.5–35.7)
MCV: 84 fL (ref 79–97)
Platelets: 288 10*3/uL (ref 150–450)
RBC: 4.3 x10E6/uL (ref 3.77–5.28)
RDW: 14 % (ref 11.7–15.4)
WBC: 7.8 10*3/uL (ref 3.4–10.8)

## 2022-09-30 ENCOUNTER — Encounter: Payer: Self-pay | Admitting: Family Medicine

## 2022-09-30 ENCOUNTER — Ambulatory Visit (INDEPENDENT_AMBULATORY_CARE_PROVIDER_SITE_OTHER): Payer: BC Managed Care – PPO | Admitting: Family Medicine

## 2022-09-30 VITALS — BP 121/87 | Ht 63.0 in | Wt 256.0 lb

## 2022-09-30 DIAGNOSIS — M25561 Pain in right knee: Secondary | ICD-10-CM | POA: Diagnosis not present

## 2022-09-30 MED ORDER — METHYLPREDNISOLONE ACETATE 40 MG/ML IJ SUSP
40.0000 mg | Freq: Once | INTRAMUSCULAR | Status: AC
  Administered 2022-09-30: 40 mg via INTRA_ARTICULAR

## 2022-09-30 NOTE — Progress Notes (Signed)
  Kristy Johnson - 47 y.o. female MRN [id number]  Date of birth: May 12, 1976    SUBJECTIVE:      Chief Complaint:/ HPI:    Right knee pain for the last 2 weeks.  She had a cramp in her leg and it woke her out of sleep.  The next day she started having some pain and swelling in her right knee.  Has not had this before.  Has had similar issues with her left knee a long time ago.  No specific injury noted to her right knee.  It feels stiff and full.  When she climbs stairs she has increased pain in the anterior portion mostly on the medial side of the knee.   OBJECTIVE: BP 121/87   Ht  (1.6 m)   Wt 256 lb (116.1 kg)   LMP 07/20/2021   BMI 45.35 kg/m   Physical Exam:  Vital signs are reviewed. GENERAL: Well-developed female no acute distress KNEES: Symmetrical.  Right knee reveals no effusion.  She is ligamentously intact to varus and valgus stress.  She is tender to the medial joint line this reproduces her pain.  Popliteal space is benign.  Calf is soft.  PROCEDURE: INJECTION: Patient was given informed consent, signed copy in the chart. Appropriate time out was taken. Area prepped and draped in usual sterile fashion. Ethyl chloride was  used for local anesthesia. A 21 gauge 1 1/2 inch needle was used..  1 cc of methylprednisolone 40 mg/ml plus 4 cc of 1% lidocaine without epinephrine was injected into the right knee using a(n) anterior medial approach.   The patient tolerated the procedure well. There were no complications. Post procedure instructions were given.   ASSESSMENT & PLAN:  See problem based charting & AVS for pt instructions. No problem-specific Assessment & Plan notes found for this encounter. I suspect she has mild meniscal irritation of some type.  I explained this to her using a model.  We discussed options such as just following this conservatively versus corticosteroid injection.  She chose injection which we did today.  If it does not totally resolved or  significantly improved in the next 3 to 4 weeks she will return to clinic.  She is currently working and going to school so she does really have time to do formal physical therapy.  She has pretty good quadricep development that is symmetrical bilaterally so I did not place her on a home exercise program but we could consider adding that if this does not resolve.

## 2023-04-13 NOTE — Progress Notes (Unsigned)
    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   History tabs reviewed and updated.   OBJECTIVE:  BP 134/85   Pulse 91   Ht 5\' 3"  (1.6 m)   Wt 267 lb 2 oz (121.2 kg)   LMP 07/20/2021   SpO2 100%   BMI 47.32 kg/m   General: Well-appearing, NAD CV: RRR, murmurs auscultated Pulm: CTAB, no WOB  ASSESSMENT/PLAN:   Assessment & Plan Screening for diabetes mellitus (DM) Obtain A1c. Screening for hyperlipidemia Lipid panel.  BMI 47, consider further discussion for weight loss purposes. Iron deficiency anemia due to chronic blood loss In the setting of dysmenorrhea, history of hysterectomy.  Last CBC showed resolve of anemia.  Recheck CBC and ferritin to assess need for continuation of ferrous sulfate. 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  Requests checking for immunity secondary to vaccines.  History of BCG vaccine.  Will obtain quant gold, anti-HBs, varicella IgG, rubella immunity panel. History of seasonal allergies Refill Allegra and Flonase. Encounter for immunization Flu vaccine given.   Discussed family history, BRCA testing not indicated.  Cervical cancer screening: not indicated given history of hysterectomy with prior normal cytology.  Breast cancer screening:  Up-to-date Colorectal cancer screening:  Discussed, will obtain at age 48.  Follow up in 1 year or sooner if indicated.   Shelby Mattocks, DO [facility]

## 2023-04-14 ENCOUNTER — Encounter: Payer: Self-pay | Admitting: Student

## 2023-04-14 ENCOUNTER — Ambulatory Visit (INDEPENDENT_AMBULATORY_CARE_PROVIDER_SITE_OTHER): Payer: Medicaid Other | Admitting: Student

## 2023-04-14 VITALS — BP 134/85 | HR 91 | Ht 63.0 in | Wt 267.1 lb

## 2023-04-14 DIAGNOSIS — Z0000 Encounter for general adult medical examination without abnormal findings: Secondary | ICD-10-CM | POA: Diagnosis not present

## 2023-04-14 DIAGNOSIS — Z131 Encounter for screening for diabetes mellitus: Secondary | ICD-10-CM | POA: Diagnosis not present

## 2023-04-14 DIAGNOSIS — Z889 Allergy status to unspecified drugs, medicaments and biological substances status: Secondary | ICD-10-CM | POA: Diagnosis not present

## 2023-04-14 DIAGNOSIS — Z13220 Encounter for screening for lipoid disorders: Secondary | ICD-10-CM | POA: Diagnosis not present

## 2023-04-14 DIAGNOSIS — D500 Iron deficiency anemia secondary to blood loss (chronic): Secondary | ICD-10-CM

## 2023-04-14 DIAGNOSIS — Z23 Encounter for immunization: Secondary | ICD-10-CM

## 2023-04-14 LAB — POCT GLYCOSYLATED HEMOGLOBIN (HGB A1C): Hemoglobin A1C: 5.9 % — AB (ref 4.0–5.6)

## 2023-04-14 MED ORDER — FEXOFENADINE HCL 60 MG PO TABS: 60.0000 mg | ORAL_TABLET | Freq: Every day | ORAL | 0 refills | Status: AC

## 2023-04-14 MED ORDER — FLUTICASONE PROPIONATE 50 MCG/ACT NA SUSP: 1.0000 | Freq: Every day | NASAL | 6 refills | Status: AC

## 2023-04-14 NOTE — Assessment & Plan Note (Signed)
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  Requests checking for immunity secondary to vaccines.  History of BCG vaccine.  Will obtain quant gold, anti-HBs, varicella IgG, rubella immunity panel.

## 2023-04-14 NOTE — Assessment & Plan Note (Signed)
Refill Allegra and Flonase.

## 2023-04-14 NOTE — Patient Instructions (Addendum)
It was great to see you today! Thank you for choosing [facility] for your primary care.  Today we addressed: We are doing some tests for your vaccination history/immunity.  Will also screen you for diabetes and high cholesterol in addition to rechecking your hemoglobin and iron to see whether you need to continue to take iron supplements.  Please consider getting your COVID-vaccine annually.  Things to do to Keep yourself Healthy - Exercise at least 30-45 minutes a day, 3-4 days a week. >150 min of moderate intensity per week is advised. - Eat a low-fat diet with lots of fruits and vegetables, up to 7-9 servings per day. - Seatbelts can save your life. Wear them always. - Smoke detectors on every level of your home, check batteries every year. - Eye Doctor - have an eye exam every 1-2 years - Safe sex - if you may be exposed to STDs, use a condom. - Alcohol If you drink, do it moderately, less than 1 drink per day. - Health Care Power of Attorney.  Choose someone to speak for you if you are not able. - Depression is common in our stressful world.If you're feeling down or losing interest in things you normally enjoy, please come in for a visit. - Violence - If anyone is threatening or hurting you, please call immediately.   If you haven't already, sign up for My Chart to have easy access to your labs results, and communication with your primary care physician. We are checking some labs today. If they are abnormal, I will call you. If they are normal, I will send you a MyChart message (if it is active) or a letter in the mail. If you do not hear about your labs in the next 2 weeks, please call the office. I recommend that you always bring your medications to each appointment as this makes it easy to ensure you are on the correct medications and helps [place] not miss refills when you need them. Return in about 1 year (around 04/13/2024) for annual physical. Please arrive 15 minutes before your  appointment to ensure smooth check in process.  We appreciate your efforts in making this happen.  Thank you for allowing me to participate in your care, Shelby Mattocks, DO 04/14/2023, 10:03 AM PGY-3, [facility]

## 2023-04-14 NOTE — Assessment & Plan Note (Signed)
In the setting of dysmenorrhea, history of hysterectomy.  Last CBC showed resolve of anemia.  Recheck CBC and ferritin to assess need for continuation of ferrous sulfate.

## 2023-04-15 ENCOUNTER — Other Ambulatory Visit: Payer: Self-pay | Admitting: Student

## 2023-04-15 DIAGNOSIS — R7303 Prediabetes: Secondary | ICD-10-CM | POA: Insufficient documentation

## 2023-04-15 LAB — CBC
Hematocrit: 37.8 % (ref 34.0–46.6)
Hemoglobin: 11.5 g/dL (ref 11.1–15.9)
MCH: 26.3 pg — ABNORMAL LOW (ref 26.6–33.0)
MCHC: 30.4 g/dL — ABNORMAL LOW (ref 31.5–35.7)
MCV: 87 fL (ref 79–97)
Platelets: 267 10*3/uL (ref 150–450)
RBC: 4.37 x10E6/uL (ref 3.77–5.28)
RDW: 13.6 % (ref 11.7–15.4)
WBC: 5.2 10*3/uL (ref 3.4–10.8)

## 2023-04-15 LAB — LIPID PANEL
Chol/HDL Ratio: 3.2 ratio (ref 0.0–4.4)
Cholesterol, Total: 178 mg/dL (ref 100–199)
HDL: 56 mg/dL (ref >39)
LDL Chol Calc (NIH): 103 mg/dL — ABNORMAL HIGH (ref 0–99)
Triglycerides: 108 mg/dL (ref 0–149)
VLDL Cholesterol Cal: 19 mg/dL (ref 5–40)

## 2023-04-15 LAB — FERRITIN: Ferritin: 53 ng/mL (ref 15–150)

## 2023-04-15 LAB — VARICELLA ZOSTER ANTIBODY, IGG: Varicella zoster IgG: REACTIVE

## 2023-04-15 LAB — MEASLES/MUMPS/RUBELLA IMMUNITY
MUMPS ABS, IGG: 90.6 [AU]/ml (ref >10.9)
RUBEOLA AB, IGG: 228 [AU]/ml (ref >16.4)
Rubella Antibodies, IGG: >33 {index} (ref >0.99)

## 2023-04-15 LAB — HEPATITIS B SURFACE ANTIBODY,QUALITATIVE: Hep B Surface Ab, Qual: REACTIVE

## 2023-04-19 LAB — QUANTIFERON-TB GOLD PLUS
QuantiFERON Mitogen Value: >10 [IU]/mL
QuantiFERON Nil Value: 0.12 [IU]/mL
QuantiFERON TB1 Ag Value: 0.1 [IU]/mL
QuantiFERON TB2 Ag Value: 0.1 [IU]/mL
QuantiFERON-TB Gold Plus: NEGATIVE

## 2023-04-22 ENCOUNTER — Telehealth: Payer: Self-pay

## 2023-04-22 NOTE — Telephone Encounter (Signed)
Pharmacy Patient Advocate Encounter   Received notification from CoverMyMeds that prior authorization for FEXOFENADINE is required/requested.   Insurance verification completed.   The patient is insured through [facility] .   Per test claim:  THE FOLLOWING is preferred by the insurance.  If suggested medication is appropriate, Please send in a new RX and discontinue this one. If not, please advise as to why it's not appropriate so that we may request a Prior Authorization.

## 2023-04-28 ENCOUNTER — Telehealth: Payer: Self-pay

## 2023-04-28 NOTE — Telephone Encounter (Signed)
Patient LVM on nurse line requesting a call back.   I called patient back, however no answer or option for VM.   Will await her return call.

## 2023-05-05 ENCOUNTER — Encounter: Payer: Self-pay | Admitting: Student

## 2023-06-02 ENCOUNTER — Telehealth: Payer: Self-pay

## 2023-06-02 NOTE — Telephone Encounter (Signed)
-----   Message from Shelby Mattocks sent at 06/02/2023 11:48 AM EST ----- Regarding: School form I have completed form and placed in RN box. Please tell patient I apologize for the delay, I did not see the message regarding her completed tetanus vaccination.

## 2023-06-02 NOTE — Telephone Encounter (Signed)
Called patient and advised that paperwork was ready for pick up and provider's message.   Copy made and placed in batch scanning.   Veronda Prude, RN

## 2023-09-25 ENCOUNTER — Ambulatory Visit: Admitting: Student

## 2023-09-26 ENCOUNTER — Ambulatory Visit

## 2023-09-26 DIAGNOSIS — H5213 Myopia, bilateral: Secondary | ICD-10-CM | POA: Diagnosis not present

## 2023-09-28 ENCOUNTER — Ambulatory Visit

## 2023-10-18 DIAGNOSIS — H11002 Unspecified pterygium of left eye: Secondary | ICD-10-CM | POA: Diagnosis not present

## 2023-11-21 ENCOUNTER — Encounter: Payer: Self-pay | Admitting: *Deleted

## 2024-07-09 ENCOUNTER — Encounter: Payer: Self-pay | Admitting: Obstetrics and Gynecology

## 2024-08-09 ENCOUNTER — Encounter: Admitting: Obstetrics and Gynecology
